# Patient Record
Sex: Male | Born: 1965 | Race: Black or African American | Hispanic: No | Marital: Married | Smoking: Current every day smoker
Health system: Southern US, Community
[De-identification: ages and names within clinical notes are randomized; demographics above are authoritative.]

## PROBLEM LIST (undated history)

## (undated) DIAGNOSIS — F419 Anxiety disorder, unspecified: Secondary | ICD-10-CM

## (undated) DIAGNOSIS — G459 Transient cerebral ischemic attack, unspecified: Secondary | ICD-10-CM

## (undated) DIAGNOSIS — E785 Hyperlipidemia, unspecified: Secondary | ICD-10-CM

## (undated) DIAGNOSIS — R519 Headache, unspecified: Secondary | ICD-10-CM

## (undated) DIAGNOSIS — M5030 Other cervical disc degeneration, unspecified cervical region: Secondary | ICD-10-CM

## (undated) DIAGNOSIS — I671 Cerebral aneurysm, nonruptured: Secondary | ICD-10-CM

## (undated) DIAGNOSIS — C801 Malignant (primary) neoplasm, unspecified: Secondary | ICD-10-CM

## (undated) DIAGNOSIS — K219 Gastro-esophageal reflux disease without esophagitis: Secondary | ICD-10-CM

## (undated) DIAGNOSIS — R51 Headache: Secondary | ICD-10-CM

## (undated) HISTORY — PX: OTHER SURGICAL HISTORY: SHX169

## (undated) HISTORY — PX: BIOPSY PHARYNX: SUR141

---

## 2005-07-23 ENCOUNTER — Encounter: Admission: RE | Admit: 2005-07-23 | Discharge: 2005-07-23 | Payer: Self-pay | Admitting: Neurosurgery

## 2013-06-22 ENCOUNTER — Encounter (HOSPITAL_BASED_OUTPATIENT_CLINIC_OR_DEPARTMENT_OTHER): Payer: Self-pay | Admitting: Emergency Medicine

## 2013-06-22 DIAGNOSIS — Z859 Personal history of malignant neoplasm, unspecified: Secondary | ICD-10-CM | POA: Insufficient documentation

## 2013-06-22 DIAGNOSIS — R071 Chest pain on breathing: Secondary | ICD-10-CM | POA: Insufficient documentation

## 2013-06-22 NOTE — ED Notes (Signed)
States had a disc compression his chiropracter popped in yesterday and today painful

## 2013-06-23 ENCOUNTER — Emergency Department (HOSPITAL_BASED_OUTPATIENT_CLINIC_OR_DEPARTMENT_OTHER)
Admission: EM | Admit: 2013-06-23 | Discharge: 2013-06-23 | Disposition: A | Discharge status: Home / Self Care | Payer: Medicaid Other | Attending: Emergency Medicine | Admitting: Emergency Medicine

## 2013-06-23 DIAGNOSIS — R0789 Other chest pain: Secondary | ICD-10-CM

## 2013-06-23 HISTORY — DX: Malignant (primary) neoplasm, unspecified: C80.1

## 2013-06-23 MED ORDER — OXYCODONE-ACETAMINOPHEN 5-325 MG PO TABS: 1.0000 | ORAL_TABLET | Freq: Four times a day (QID) | ORAL | Status: DC | PRN

## 2013-06-23 MED ORDER — HYDROMORPHONE HCL PF 1 MG/ML IJ SOLN
1.0000 mg | Freq: Once | INTRAMUSCULAR | Status: AC
  Administered 2013-06-23: 1 mg via INTRAMUSCULAR
  Filled 2013-06-23: qty 1

## 2013-06-23 NOTE — ED Provider Notes (Signed)
CSN: [id number]     Arrival date & time 06/22/13  2331 History   First MD Initiated Contact with Patient 06/23/13 0251     Chief Complaint  Patient presents with  . Rib Injury     (Consider location/radiation/quality/duration/timing/severity/associated sxs/prior Treatment) HPI This is a 48 year old male with a long-standing problem of degenerative disc disease in his neck and back. He states he has a rib in his left mid chest that frequently pops out of place. This rib has been causing him pain for the past 4 days. The pain is sharp, moderate to severe in intensity, and radiates to his left side and neck. The pain is somewhat relieved by resting his left hand on top of his head. He had the rib "pop back in place" by his chiropractor yesterday without adequate relief of the pain. He is requesting pain relief. Although he has a prescription for OxyContin at home he states he is unable to take this because it is too strong. He denies shortness of breath or neurologic deficits although the pain does radiate down his left upper extremity.  Past Medical History  Diagnosis Date  . Cancer    History reviewed. No pertinent past surgical history. No family history on file. History  Substance Use Topics  . Smoking status: Former Research scientist (life sciences)  . Smokeless tobacco: Former Systems developer    Quit date: 04/13/2013  . Alcohol Use: No    Review of Systems  All other systems reviewed and are negative.   Allergies  Review of patient's allergies indicates no known allergies.  Home Medications   Current Outpatient Rx  Name  Route  Sig  Dispense  Refill  . oxyCODONE (OXY IR/ROXICODONE) 5 MG immediate release tablet   Oral   Take 5 mg by mouth every 4 (four) hours as needed for severe pain.          BP 121/77  Pulse 70  Temp(Src) 97.9 F (36.6 C) (Oral)  Resp 16  Ht 5\' 9"  (1.753 m)  Wt 158 lb (71.668 kg)  BMI 23.32 kg/m2  SpO2 100%  Physical Exam General: Well-developed, well-nourished male in no  acute distress; appearance consistent with age of record HENT: normocephalic; atraumatic Eyes: pupils equal, round and reactive to light; extraocular muscles intact Neck: supple Heart: regular rate and rhythm Lungs: clear to auscultation bilaterally Chest: Left posterior chest wall point tenderness without deformity Abdomen: soft; nondistended Extremities: No deformity; full range of motion Neurologic: Awake, alert and oriented; motor function intact in all extremities and symmetric; no facial droop Skin: Warm and dry Psychiatric: Angry mood with congruent affect    ED Course  Procedures (including critical care time)    MDM      Wynetta Fines, MD 06/23/13 [phone]

## 2013-06-23 NOTE — ED Notes (Signed)
MD at bedside. 

## 2013-06-23 NOTE — ED Notes (Signed)
Rx x 1 given for percocet 

## 2013-06-23 NOTE — Discharge Instructions (Signed)

## 2013-12-29 ENCOUNTER — Emergency Department (HOSPITAL_BASED_OUTPATIENT_CLINIC_OR_DEPARTMENT_OTHER): Payer: Medicaid Other

## 2013-12-29 ENCOUNTER — Emergency Department (HOSPITAL_BASED_OUTPATIENT_CLINIC_OR_DEPARTMENT_OTHER)
Admission: EM | Admit: 2013-12-29 | Discharge: 2013-12-29 | Disposition: A | Discharge status: Home / Self Care | Payer: Medicaid Other | Attending: Emergency Medicine | Admitting: Emergency Medicine

## 2013-12-29 ENCOUNTER — Encounter (HOSPITAL_BASED_OUTPATIENT_CLINIC_OR_DEPARTMENT_OTHER): Payer: Self-pay | Admitting: Emergency Medicine

## 2013-12-29 DIAGNOSIS — K5900 Constipation, unspecified: Secondary | ICD-10-CM | POA: Diagnosis not present

## 2013-12-29 DIAGNOSIS — K219 Gastro-esophageal reflux disease without esophagitis: Secondary | ICD-10-CM | POA: Diagnosis not present

## 2013-12-29 DIAGNOSIS — Z859 Personal history of malignant neoplasm, unspecified: Secondary | ICD-10-CM | POA: Diagnosis not present

## 2013-12-29 DIAGNOSIS — R1084 Generalized abdominal pain: Secondary | ICD-10-CM | POA: Insufficient documentation

## 2013-12-29 DIAGNOSIS — Z87891 Personal history of nicotine dependence: Secondary | ICD-10-CM | POA: Diagnosis not present

## 2013-12-29 DIAGNOSIS — Z8739 Personal history of other diseases of the musculoskeletal system and connective tissue: Secondary | ICD-10-CM | POA: Diagnosis not present

## 2013-12-29 DIAGNOSIS — Z79899 Other long term (current) drug therapy: Secondary | ICD-10-CM | POA: Diagnosis not present

## 2013-12-29 DIAGNOSIS — T474X5A Adverse effect of other laxatives, initial encounter: Secondary | ICD-10-CM | POA: Diagnosis not present

## 2013-12-29 DIAGNOSIS — T472X5A Adverse effect of stimulant laxatives, initial encounter: Secondary | ICD-10-CM

## 2013-12-29 DIAGNOSIS — R109 Unspecified abdominal pain: Secondary | ICD-10-CM

## 2013-12-29 DIAGNOSIS — IMO0002 Reserved for concepts with insufficient information to code with codable children: Secondary | ICD-10-CM | POA: Diagnosis not present

## 2013-12-29 HISTORY — DX: Gastro-esophageal reflux disease without esophagitis: K21.9

## 2013-12-29 HISTORY — DX: Other cervical disc degeneration, unspecified cervical region: M50.30

## 2013-12-29 LAB — URINALYSIS, ROUTINE W REFLEX MICROSCOPIC
BILIRUBIN URINE: NEGATIVE
GLUCOSE, UA: NEGATIVE mg/dL
Hgb urine dipstick: NEGATIVE
Ketones, ur: 15 mg/dL — AB
Leukocytes, UA: NEGATIVE
NITRITE: NEGATIVE
PROTEIN: NEGATIVE mg/dL
Specific Gravity, Urine: 1.03 (ref 1.005–1.030)
UROBILINOGEN UA: 0.2 mg/dL (ref 0.0–1.0)
pH: 5.5 (ref 5.0–8.0)

## 2013-12-29 MED ORDER — DICYCLOMINE HCL 10 MG/ML IM SOLN
20.0000 mg | Freq: Once | INTRAMUSCULAR | Status: AC
  Administered 2013-12-29: 20 mg via INTRAMUSCULAR
  Filled 2013-12-29: qty 2

## 2013-12-29 NOTE — ED Notes (Signed)
Pt has ambulated to restroom x4 - reports improvement in pain.

## 2013-12-29 NOTE — ED Notes (Signed)
Pt reports acute onset of generalized abd pain that awoke pt from his sleep approx 0100 this a.m. - pt states that the first week of September he did not have a bowel movement, pt had x2 BMs Monday and Tuesday this week that were black in color - pt was seen by his pain management provider today and was advised to get OTC mag citrate and fleets enemas as he may be experiencing constipation d/t his rx of percocet. Pt took magnesium citrate approx 12:00 yesterday and the fleets enemas yesterday approx 2230 - pt states he has not had a bowel movement yet. C/o nausea - no vomiting.

## 2013-12-29 NOTE — ED Notes (Signed)
Pt ambulating independently w/ steady gait on d/c in no acute distress, A&Ox4.D/c instructions reviewed w/ pt and family - pt and family deny any further questions or concerns at present.  

## 2013-12-29 NOTE — ED Provider Notes (Signed)
CSN: [id number]     Arrival date & time 12/29/13  0138 History   First MD Initiated Contact with Patient 12/29/13 0242     Chief Complaint  Patient presents with  . Abdominal Pain     (Consider location/radiation/quality/duration/timing/severity/associated sxs/prior Treatment) Patient is a 48 y.o. male presenting with abdominal pain. The history is provided by the patient.  Abdominal Pain Pain location:  Generalized Pain quality: cramping   Pain radiates to:  Does not radiate Pain severity:  Moderate Onset quality:  Sudden Timing:  Constant Progression:  Resolved Chronicity:  New Context: laxative use   Context: not suspicious food intake   Relieved by:  Nothing Worsened by:  Nothing tried Ineffective treatments:  None tried Associated symptoms: constipation   Associated symptoms: no anorexia, no fever, no hematemesis and no vomiting   Risk factors: has not had multiple surgeries   No BM between 8/31 and 9/15 then had BM on Monday and Tuesday but still felt bloated and seen by pain management who thought his narcotics were to blame.  Talked to them on phone and they recommended mag citrate which he took on Thursday afternoon then no BM so he took an enema before bed and woke with cramping now > 5 liquid BMs and feels markedly improved  Past Medical History  Diagnosis Date  . Cancer   . DDD (degenerative disc disease), cervical   . Acid reflux    History reviewed. No pertinent past surgical history. History reviewed. No pertinent family history. History  Substance Use Topics  . Smoking status: Former Research scientist (life sciences)  . Smokeless tobacco: Former Systems developer    Quit date: 04/13/2013  . Alcohol Use: No    Review of Systems  Constitutional: Negative for fever.  Gastrointestinal: Positive for abdominal pain and constipation. Negative for vomiting, anorexia and hematemesis.  All other systems reviewed and are negative.     Allergies  Review of patient's allergies indicates no known  allergies.  Home Medications   Prior to Admission medications   Medication Sig Start Date End Date Taking? Authorizing Provider  esomeprazole (NEXIUM) 40 MG capsule Take 40 mg by mouth daily at 12 noon.   Yes Historical Provider, MD  fluticasone (FLONASE) 50 MCG/ACT nasal spray Place into both nostrils daily.   Yes Historical Provider, MD  sodium chloride (OCEAN) 0.65 % SOLN nasal spray Place 1 spray into both nostrils as needed for congestion.   Yes Historical Provider, MD  oxyCODONE (OXY IR/ROXICODONE) 5 MG immediate release tablet Take 5 mg by mouth every 4 (four) hours as needed for severe pain.    Historical Provider, MD  oxyCODONE-acetaminophen (PERCOCET/ROXICET) 5-325 MG per tablet Take 1-2 tablets by mouth every 6 (six) hours as needed (for pain). 06/23/13   John L Molpus, MD   BP 135/76  Pulse 94  Temp(Src) 98.4 F (36.9 C) (Oral)  Resp 20  SpO2 99% Physical Exam  Constitutional: He is oriented to person, place, and time. He appears well-developed and well-nourished. No distress.  HENT:  Head: Normocephalic and atraumatic.  Mouth/Throat: Oropharynx is clear and moist.  Eyes: Conjunctivae are normal. Pupils are equal, round, and reactive to light.  Neck: Normal range of motion. Neck supple.  Cardiovascular: Normal rate, regular rhythm and intact distal pulses.   Pulmonary/Chest: Effort normal and breath sounds normal. He has no wheezes. He has no rales.  Abdominal: Soft. He exhibits no distension and no mass. Bowel sounds are increased. There is no tenderness. There is no rigidity, no  rebound, no guarding, no tenderness at McBurney's point and negative Murphy's sign.  Musculoskeletal: Normal range of motion.  Neurological: He is alert and oriented to person, place, and time.  Skin: Skin is warm and dry.  Psychiatric: He has a normal mood and affect.    ED Course  Procedures (including critical care time) Labs Review Labs Reviewed  URINALYSIS, ROUTINE W REFLEX MICROSCOPIC  - Abnormal; Notable for the following:    Color, Urine AMBER (*)    Ketones, ur 15 (*)    All other components within normal limits    Imaging Review No results found.   EKG Interpretation None      MDM   Final diagnoses:  None   Exam and vitals benign and reassuring.  Patient feels back to normal.  No indication for advanced imaging at this time.    Suspect pain due to combination of mag citrate and enema.  Patient feels markedly improved.  Will refer back to GI for ongoing care.  Has endoscopy and colonoscopy scheduled.     Carlisle Beers, MD 12/29/13 [phone]

## 2013-12-30 ENCOUNTER — Encounter (HOSPITAL_BASED_OUTPATIENT_CLINIC_OR_DEPARTMENT_OTHER): Payer: Self-pay | Admitting: Emergency Medicine

## 2013-12-30 ENCOUNTER — Inpatient Hospital Stay (HOSPITAL_BASED_OUTPATIENT_CLINIC_OR_DEPARTMENT_OTHER)
Admission: EM | Admit: 2013-12-30 | Discharge: 2014-01-01 | DRG: 069 | Disposition: A | Discharge status: Home / Self Care | Payer: Medicaid Other | Attending: Family Medicine | Admitting: Family Medicine

## 2013-12-30 ENCOUNTER — Emergency Department (HOSPITAL_BASED_OUTPATIENT_CLINIC_OR_DEPARTMENT_OTHER): Payer: Medicaid Other

## 2013-12-30 DIAGNOSIS — Z923 Personal history of irradiation: Secondary | ICD-10-CM

## 2013-12-30 DIAGNOSIS — Z87891 Personal history of nicotine dependence: Secondary | ICD-10-CM

## 2013-12-30 DIAGNOSIS — G459 Transient cerebral ischemic attack, unspecified: Secondary | ICD-10-CM

## 2013-12-30 DIAGNOSIS — R531 Weakness: Secondary | ICD-10-CM

## 2013-12-30 DIAGNOSIS — K219 Gastro-esophageal reflux disease without esophagitis: Secondary | ICD-10-CM | POA: Diagnosis present

## 2013-12-30 DIAGNOSIS — I671 Cerebral aneurysm, nonruptured: Secondary | ICD-10-CM | POA: Diagnosis present

## 2013-12-30 DIAGNOSIS — F1210 Cannabis abuse, uncomplicated: Secondary | ICD-10-CM | POA: Diagnosis present

## 2013-12-30 DIAGNOSIS — E785 Hyperlipidemia, unspecified: Secondary | ICD-10-CM

## 2013-12-30 DIAGNOSIS — Z85819 Personal history of malignant neoplasm of unspecified site of lip, oral cavity, and pharynx: Secondary | ICD-10-CM

## 2013-12-30 DIAGNOSIS — Z79899 Other long term (current) drug therapy: Secondary | ICD-10-CM

## 2013-12-30 DIAGNOSIS — M5030 Other cervical disc degeneration, unspecified cervical region: Secondary | ICD-10-CM | POA: Diagnosis present

## 2013-12-30 DIAGNOSIS — R200 Anesthesia of skin: Secondary | ICD-10-CM

## 2013-12-30 HISTORY — DX: Transient cerebral ischemic attack, unspecified: G45.9

## 2013-12-30 LAB — BASIC METABOLIC PANEL
ANION GAP: 18 — AB (ref 5–15)
BUN: 14 mg/dL (ref 6–23)
CHLORIDE: 102 meq/L (ref 96–112)
CO2: 20 mEq/L (ref 19–32)
CREATININE: 1.2 mg/dL (ref 0.50–1.35)
Calcium: 9.7 mg/dL (ref 8.4–10.5)
GFR, EST AFRICAN AMERICAN: 80 mL/min — AB (ref >90)
GFR, EST NON AFRICAN AMERICAN: 69 mL/min — AB (ref >90)
GLUCOSE: 106 mg/dL — AB (ref 70–99)
Potassium: 4.1 mEq/L (ref 3.7–5.3)
Sodium: 140 mEq/L (ref 137–147)

## 2013-12-30 LAB — CBC WITH DIFFERENTIAL/PLATELET
BASOS PCT: 0 % (ref 0–1)
Basophils Absolute: 0 10*3/uL (ref 0.0–0.1)
EOS ABS: 0.1 10*3/uL (ref 0.0–0.7)
EOS PCT: 1 % (ref 0–5)
HEMATOCRIT: 41.8 % (ref 39.0–52.0)
HEMOGLOBIN: 14.4 g/dL (ref 13.0–17.0)
Lymphocytes Relative: 26 % (ref 12–46)
Lymphs Abs: 2.7 10*3/uL (ref 0.7–4.0)
MCH: 24 pg — ABNORMAL LOW (ref 26.0–34.0)
MCHC: 34.4 g/dL (ref 30.0–36.0)
MCV: 69.7 fL — ABNORMAL LOW (ref 78.0–100.0)
Monocytes Absolute: 0.8 10*3/uL (ref 0.1–1.0)
Monocytes Relative: 8 % (ref 3–12)
NEUTROS ABS: 6.8 10*3/uL (ref 1.7–7.7)
NEUTROS PCT: 65 % (ref 43–77)
PLATELETS: 269 10*3/uL (ref 150–400)
RBC: 6 MIL/uL — AB (ref 4.22–5.81)
RDW: 15.9 % — AB (ref 11.5–15.5)
WBC: 10.4 10*3/uL (ref 4.0–10.5)

## 2013-12-30 LAB — TROPONIN I

## 2013-12-30 NOTE — ED Provider Notes (Signed)
CSN: [id number]     Arrival date & time 12/30/13  2213 History  This chart was scribed for Julianne Rice, MD by Martinique Peace, ED Scribe. The patient was seen in [place]. The patient's care was started at 11:14 PM.      Chief Complaint  Patient presents with  . Extremity Weakness     HPI HPI Comments: Bradley Brock is a 48 y.o. male who presents to the Emergency Department complaining of numbness and weakness onset 9:45 PM tonight to left lower aspect of his face, his left arm and down his back into his left leg. He reports experiencing facial numbness that has partially resolved and facial droop that has completely resolved. Pt goes on to report that his left leg currently feels heavy. He notes much improvement in his symptoms and feeling better by his interpretation. He reports that he was very mad and anxious at onset of episode but denies any previous occurences regarding those emotions bringing on. Pt reports history of stroke 4 years ago where he had to be rushed to the hospital. He further states history of degenerative disease in his cervical spine and previous existing numbness in his left hand. He denies any changes in vision.    Past Medical History  Diagnosis Date  . Cancer   . DDD (degenerative disc disease), cervical   . Acid reflux    History reviewed. No pertinent past surgical history. History reviewed. No pertinent family history. History  Substance Use Topics  . Smoking status: Former Research scientist (life sciences)  . Smokeless tobacco: Former Systems developer    Quit date: 04/13/2013  . Alcohol Use: No    Review of Systems  Constitutional: Negative for fever and chills.  HENT:       Facial numbness and droopiness.   Eyes: Negative for visual disturbance.  Respiratory: Negative for cough and shortness of breath.   Cardiovascular: Negative for chest pain, palpitations and leg swelling.  Gastrointestinal: Negative for nausea, vomiting and abdominal pain.  Musculoskeletal: Negative for back  pain, neck pain and neck stiffness.  Skin: Negative for rash and wound.  Neurological: Positive for weakness and numbness. Negative for dizziness, speech difficulty and headaches.  Psychiatric/Behavioral: The patient is nervous/anxious.   All other systems reviewed and are negative.     Allergies  Review of patient's allergies indicates no known allergies.  Home Medications   Prior to Admission medications   Medication Sig Start Date End Date Taking? Authorizing Provider  esomeprazole (NEXIUM) 40 MG capsule Take 40 mg by mouth daily at 12 noon.    Historical Provider, MD  fluticasone (FLONASE) 50 MCG/ACT nasal spray Place into both nostrils daily.    Historical Provider, MD  oxyCODONE (OXY IR/ROXICODONE) 5 MG immediate release tablet Take 5 mg by mouth every 4 (four) hours as needed for severe pain.    Historical Provider, MD  oxyCODONE-acetaminophen (PERCOCET/ROXICET) 5-325 MG per tablet Take 1-2 tablets by mouth every 6 (six) hours as needed (for pain). 06/23/13   John L Molpus, MD  sodium chloride (OCEAN) 0.65 % SOLN nasal spray Place 1 spray into both nostrils as needed for congestion.    Historical Provider, MD   BP 110/79  Pulse 95  Temp(Src) 98.1 F (36.7 C) (Oral)  Resp 24  SpO2 100% Physical Exam  Nursing note and vitals reviewed. Constitutional: He is oriented to person, place, and time. He appears well-developed and well-nourished. No distress.  HENT:  Head: Normocephalic and atraumatic.  Mouth/Throat: Oropharynx is clear and moist.  Eyes: EOM are normal. Pupils are equal, round, and reactive to light.  Neck: Normal range of motion. Neck supple.  No meningismus. No severe cervical tenderness to palpation.  Cardiovascular: Normal rate and regular rhythm.   Pulmonary/Chest: Effort normal and breath sounds normal. No respiratory distress. He has no wheezes. He has no rales.  Abdominal: Soft. Bowel sounds are normal. He exhibits no distension and no mass. There is no  tenderness. There is no rebound.  Musculoskeletal: Normal range of motion. He exhibits no edema and no tenderness.  Neurological: He is alert and oriented to person, place, and time.  Patient is alert and oriented x3 with clear, goal oriented speech. Patient has 5/5 motor in bilateral upper and right lower extremities. Patient has 4/5 motor in left lower extremity. Decreased sensation to light touch and left lower face and left upper extremity. Bilateral finger-to-nose is normal with no signs of dysmetria. Visual fields intact   Skin: Skin is warm and dry. No rash noted. No erythema.  Psychiatric: He has a normal mood and affect. His behavior is normal.    ED Course  Procedures (including critical care time) Labs Review Labs Reviewed  CBC WITH DIFFERENTIAL - Abnormal; Notable for the following:    RBC 6.00 (*)    MCV 69.7 (*)    MCH 24.0 (*)    RDW 15.9 (*)    All other components within normal limits  BASIC METABOLIC PANEL  TROPONIN I    Imaging Review Dg Abd Acute W/chest  12/29/2013   CLINICAL DATA:  Constipation, abdominal pain and nausea. Abdominal bloating.  EXAM: ACUTE ABDOMEN SERIES (ABDOMEN 2 VIEW & CHEST 1 VIEW)  COMPARISON:  None.  FINDINGS: The lungs are well-aerated and clear. There is no evidence of focal opacification, pleural effusion or pneumothorax. The cardiomediastinal silhouette is within normal limits.  The visualized bowel gas pattern is unremarkable. Scattered fluid and air are seen within the colon; there is no evidence of small bowel dilatation to suggest obstruction. No free intra-abdominal air is identified on the provided upright view.  No acute osseous abnormalities are seen; the sacroiliac joints are unremarkable in appearance.  IMPRESSION: 1. Unremarkable bowel gas pattern; no free intra-abdominal air seen. Fluid seen partially filling the colon. No significant stool seen in the colon. 2. No acute cardiopulmonary process seen.   Electronically Signed   By:  Garald Balding M.D.   On: 12/29/2013 03:00     EKG Interpretation None     Medications - No data to display  11:19 PM- Treatment plan was discussed with patient who verbalizes understanding and agrees.   MDM   Final diagnoses:  None    I personally performed the services described in this documentation, which was scribed in my presence. The recorded information has been reviewed and is accurate. Code stroke initiated after exam. Symptoms appear to be resolving. Do not believe the patient is a candidate for TPA given low stroke scale score and improving symptoms.  Discussed with Dr. [name]. Recommends transfer to [facility] for [facility]. Dr. Tamera Punt will accept patient in transfer. Discussed with Dr. [name]. Recommends transfer to [facility] [place] for evaluation.   Julianne Rice, MD 12/31/13 [phone]

## 2013-12-30 NOTE — ED Notes (Signed)
Pt reports (L) facial, arm and leg weakness, heaviness, and numbness that was sudden onset at 945pm tonight.  Reports that he was very anxious at the time of onset.  Denies HA.  Reports that he has slight pain behind his (L) chest only when deep inspiration.  Denies change in vision.  Reports that he always has (L) arm numbness and weakness but that it is worse tonight.  While talking with patient, symptoms resolved.  Pt very tachypneic and diaphoretic on assessment.

## 2013-12-31 ENCOUNTER — Inpatient Hospital Stay (HOSPITAL_COMMUNITY): Payer: Medicaid Other

## 2013-12-31 ENCOUNTER — Encounter (HOSPITAL_COMMUNITY): Payer: Self-pay | Admitting: Internal Medicine

## 2013-12-31 DIAGNOSIS — K219 Gastro-esophageal reflux disease without esophagitis: Secondary | ICD-10-CM | POA: Diagnosis present

## 2013-12-31 DIAGNOSIS — Z85819 Personal history of malignant neoplasm of unspecified site of lip, oral cavity, and pharynx: Secondary | ICD-10-CM | POA: Diagnosis not present

## 2013-12-31 DIAGNOSIS — G459 Transient cerebral ischemic attack, unspecified: Secondary | ICD-10-CM | POA: Diagnosis not present

## 2013-12-31 DIAGNOSIS — F1210 Cannabis abuse, uncomplicated: Secondary | ICD-10-CM | POA: Diagnosis present

## 2013-12-31 DIAGNOSIS — Z79899 Other long term (current) drug therapy: Secondary | ICD-10-CM | POA: Diagnosis not present

## 2013-12-31 DIAGNOSIS — M6281 Muscle weakness (generalized): Secondary | ICD-10-CM

## 2013-12-31 DIAGNOSIS — R209 Unspecified disturbances of skin sensation: Secondary | ICD-10-CM

## 2013-12-31 DIAGNOSIS — Z87891 Personal history of nicotine dependence: Secondary | ICD-10-CM | POA: Diagnosis not present

## 2013-12-31 DIAGNOSIS — Z923 Personal history of irradiation: Secondary | ICD-10-CM | POA: Diagnosis not present

## 2013-12-31 DIAGNOSIS — M5030 Other cervical disc degeneration, unspecified cervical region: Secondary | ICD-10-CM | POA: Diagnosis present

## 2013-12-31 DIAGNOSIS — R531 Weakness: Secondary | ICD-10-CM | POA: Insufficient documentation

## 2013-12-31 LAB — TROPONIN I: Troponin I: <0.3 ng/mL (ref <0.30)

## 2013-12-31 LAB — TSH: TSH: 2.62 u[IU]/mL (ref 0.350–4.500)

## 2013-12-31 LAB — HEMOGLOBIN A1C
HEMOGLOBIN A1C: 6 % — AB (ref <5.7)
Mean Plasma Glucose: 126 mg/dL — ABNORMAL HIGH (ref <117)

## 2013-12-31 LAB — CBC WITH DIFFERENTIAL/PLATELET
Basophils Absolute: 0 10*3/uL (ref 0.0–0.1)
Basophils Relative: 0 % (ref 0–1)
Eosinophils Absolute: 0.2 10*3/uL (ref 0.0–0.7)
Eosinophils Relative: 2 % (ref 0–5)
HCT: 40.1 % (ref 39.0–52.0)
Hemoglobin: 13.5 g/dL (ref 13.0–17.0)
Lymphocytes Relative: 32 % (ref 12–46)
Lymphs Abs: 2.7 10*3/uL (ref 0.7–4.0)
MCH: 23.9 pg — ABNORMAL LOW (ref 26.0–34.0)
MCHC: 33.7 g/dL (ref 30.0–36.0)
MCV: 70.8 fL — AB (ref 78.0–100.0)
MONOS PCT: 8 % (ref 3–12)
Monocytes Absolute: 0.7 10*3/uL (ref 0.1–1.0)
NEUTROS ABS: 4.9 10*3/uL (ref 1.7–7.7)
Neutrophils Relative %: 58 % (ref 43–77)
Platelets: 250 10*3/uL (ref 150–400)
RBC: 5.66 MIL/uL (ref 4.22–5.81)
RDW: 14.1 % (ref 11.5–15.5)
WBC: 8.5 10*3/uL (ref 4.0–10.5)

## 2013-12-31 LAB — LIPID PANEL
Cholesterol: 165 mg/dL (ref 0–200)
HDL: 39 mg/dL — ABNORMAL LOW (ref >39)
LDL CALC: 113 mg/dL — AB (ref 0–99)
TRIGLYCERIDES: 65 mg/dL (ref <150)
Total CHOL/HDL Ratio: 4.2 RATIO
VLDL: 13 mg/dL (ref 0–40)

## 2013-12-31 LAB — RAPID URINE DRUG SCREEN, HOSP PERFORMED
Amphetamines: NOT DETECTED
BARBITURATES: NOT DETECTED
BENZODIAZEPINES: NOT DETECTED
Cocaine: NOT DETECTED
Opiates: NOT DETECTED
Tetrahydrocannabinol: POSITIVE — AB

## 2013-12-31 LAB — COMPREHENSIVE METABOLIC PANEL
ALBUMIN: 4.2 g/dL (ref 3.5–5.2)
ALT: 12 U/L (ref 0–53)
ANION GAP: 20 — AB (ref 5–15)
AST: 23 U/L (ref 0–37)
Alkaline Phosphatase: 60 U/L (ref 39–117)
BILIRUBIN TOTAL: 0.6 mg/dL (ref 0.3–1.2)
BUN: 15 mg/dL (ref 6–23)
CHLORIDE: 104 meq/L (ref 96–112)
CO2: 13 mEq/L — ABNORMAL LOW (ref 19–32)
CREATININE: 0.98 mg/dL (ref 0.50–1.35)
Calcium: 9.2 mg/dL (ref 8.4–10.5)
GFR calc Af Amer: >90 mL/min (ref >90)
GFR calc non Af Amer: >90 mL/min (ref >90)
Glucose, Bld: 78 mg/dL (ref 70–99)
Potassium: 4.3 mEq/L (ref 3.7–5.3)
Sodium: 137 mEq/L (ref 137–147)
Total Protein: 7.1 g/dL (ref 6.0–8.3)

## 2013-12-31 LAB — PROTIME-INR
INR: 1.03 (ref 0.00–1.49)
Prothrombin Time: 13.5 seconds (ref 11.6–15.2)

## 2013-12-31 LAB — APTT: aPTT: 32 seconds (ref 24–37)

## 2013-12-31 MED ORDER — ASPIRIN 300 MG RE SUPP: 300.0000 mg | Freq: Every day | RECTAL | Status: DC

## 2013-12-31 MED ORDER — STROKE: EARLY STAGES OF RECOVERY BOOK
Freq: Once | Status: AC
  Administered 2013-12-31: 03:00:00
  Filled 2013-12-31: qty 1

## 2013-12-31 MED ORDER — LORAZEPAM 2 MG/ML IJ SOLN
1.0000 mg | Freq: Once | INTRAMUSCULAR | Status: AC
  Administered 2013-12-31: 1 mg via INTRAVENOUS
  Filled 2013-12-31: qty 1

## 2013-12-31 MED ORDER — ASPIRIN 325 MG PO TABS
325.0000 mg | ORAL_TABLET | Freq: Every day | ORAL | Status: DC
  Administered 2013-12-31 – 2014-01-01 (×2): 325 mg via ORAL
  Filled 2013-12-31 (×2): qty 1

## 2013-12-31 MED ORDER — SIMVASTATIN 20 MG PO TABS
20.0000 mg | ORAL_TABLET | Freq: Every day | ORAL | Status: DC
  Administered 2013-12-31 – 2014-01-01 (×2): 20 mg via ORAL
  Filled 2013-12-31 (×2): qty 1

## 2013-12-31 MED ORDER — PANTOPRAZOLE SODIUM 40 MG PO TBEC
40.0000 mg | DELAYED_RELEASE_TABLET | Freq: Every day | ORAL | Status: DC
  Administered 2013-12-31 – 2014-01-01 (×2): 40 mg via ORAL
  Filled 2013-12-31 (×2): qty 1

## 2013-12-31 MED ORDER — OXYCODONE HCL 5 MG PO TABS: 5.0000 mg | ORAL_TABLET | ORAL | Status: DC | PRN

## 2013-12-31 MED ORDER — FLUTICASONE PROPIONATE 50 MCG/ACT NA SUSP
2.0000 | Freq: Every day | NASAL | Status: DC
  Filled 2013-12-31: qty 16

## 2013-12-31 MED ORDER — ENOXAPARIN SODIUM 40 MG/0.4ML ~~LOC~~ SOLN
40.0000 mg | Freq: Every day | SUBCUTANEOUS | Status: DC
  Administered 2013-12-31 – 2014-01-01 (×2): 40 mg via SUBCUTANEOUS
  Filled 2013-12-31 (×2): qty 0.4

## 2013-12-31 MED ORDER — SENNOSIDES-DOCUSATE SODIUM 8.6-50 MG PO TABS: 1.0000 | ORAL_TABLET | Freq: Every evening | ORAL | Status: DC | PRN

## 2013-12-31 NOTE — Progress Notes (Signed)
Pt trasnferred to unit from ED via EMS x 1. Pt alert and oriented upon arrival. No signs or symptoms of acute distress. No complaints of pain or discomfort. Pt connected to telemetry and central monitoring notified. Pt oriented to room and unit. Pt now resting in bed at lowest position with call light in reach. Will continue to monitor. Fortino Sic, RN, BSN 12/31/2013 2:35 AM

## 2013-12-31 NOTE — Consult Note (Signed)
Neurology Consultation Reason for Consult: Left-sided numbness Referring Physician: Stark Jock, D  CC: Left-sided numbness  History is obtained from: Patient  HPI: Bradley Brock is a 48 y.o. male with a history of esophageal cancer who presents with  left-sided numbness and weakness which is improving, though not completely gone. He states that around 9:45 PM, he noticed that his left face was numb. On arrival to [facility] [place], he was seen to have a left facial droop (per verbal report) that improved.   He continues to have mild left leg weakness and mild numbness, but these are improving   LKW: 9:45 PM tpa given?: no, mild symptoms, improving symptoms    ROS: A 14 point ROS was performed and is negative except as noted in the HPI.   Past Medical History  Diagnosis Date  . Cancer   . DDD (degenerative disc disease), cervical   . Acid reflux     Family History: No history of similar  Social History: Tob: Former smoker  Exam: Current vital signs: BP 123/82  Pulse 77  Temp(Src) 98.1 F (36.7 C) (Oral)  Resp 23  SpO2 100% Vital signs in last 24 hours: Temp:  [98.1 F (36.7 C)] 98.1 F (36.7 C) (09/19 2346) Pulse Rate:  [71-95] 77 (09/19 2330) Resp:  [20-24] 23 (09/19 2330) BP: (110-124)/(79-82) 123/82 mmHg (09/19 2330) SpO2:  [98 %-100 %] 100 % (09/19 2330)  General: In bed, NAD CV: Regular rate and rhythm Mental Status: Patient is awake, alert, oriented to person, place, month, year, and situation. Immediate and remote memory are intact. Patient is able to give a clear and coherent history. No signs of aphasia. He is able to correctly identify all aspects of the "cookie caper" picture. He has no visual neglect. He intermittently appears to extinguish to double simultaneous stimulation, but inconsistently. Cranial Nerves: II: Visual Fields are full. Pupils are equal, round, and reactive to light.  Discs are difficult to visualize. III,IV, VI: EOMI without  ptosis or diploplia.  V: Facial sensation is decreased on the left VII: Facial movement is symmetric.  VIII: hearing is intact to voice X: Uvula elevates symmetrically XI: Shoulder shrug is symmetric. XII: tongue is midline without atrophy or fasciculations.  Motor: Tone is normal. Bulk is normal. 5/5 strength was present in bilateral arms and right leg, 4+/5 strength in the left leg Sensory: Sensation is decreased in the left face, arm, leg. He does have improved sensation in his medial aspect of his left forearm compared to the rest of his arm. (Baseline) Deep Tendon Reflexes: 2+ and symmetric in the biceps and patellae.  Cerebellar: FNF intact bilaterally, his left heel-knee-shin is consistent with weakness. Gait: Able to ambulate independently but does favor his right leg   I have reviewed labs in epic and the results pertinent to this consultation are: BMP-unremarkable CBC-unremarkable  I have reviewed the images obtained: CT head-no acute findings  Impression: 48 year old male with left arm face and leg numbness and leg weakness most consistent with an acute ischemic stroke. The symptoms are very mild, and improving and therefore would not recommend IV TPA at this time. He will need be admitted for stroke workup.  Recommendations: 1. HgbA1c, fasting lipid panel 2. MRI, MRA  of the brain without contrast 3. Frequent neuro checks 4. Echocardiogram 5. Carotid dopplers 6. Prophylactic therapy-Antiplatelet med: Aspirin - dose 325mg  PO or 300mg  PR 7. Risk factor modification 8. Telemetry monitoring 9. PT consult, OT consult, Speech consult  Roland Rack, MD [facility] [phone]  If 7pm- 7am, please page neurology on call as listed in [place].

## 2013-12-31 NOTE — Progress Notes (Addendum)
STROKE TEAM PROGRESS NOTE   HISTORY Logen Heintzelman is a 48 y.o. male with a history of esophageal cancer who presents with left-sided numbness and weakness which is improving, though not completely gone. He states that around 9:45 PM, he noticed that his left face was numb. On arrival to [facility] [place], he was seen to have a left facial droop (per verbal report) that improved.   LKW: 9:45 PM  tpa given?: no, mild symptoms, improving symptoms  SUBJECTIVE (INTERVAL HISTORY) Patient is feeling better. Only his leg is still mildly numb.   OBJECTIVE Temp:  [98.1 F (36.7 C)-99 F (37.2 C)] 98.8 F (37.1 C) (09/20 0630) Pulse Rate:  [63-95] 63 (09/20 1704) Cardiac Rhythm:  [-]  Resp:  [14-24] 18 (09/20 1704) BP: (99-131)/(62-101) 102/62 mmHg (09/20 1704) SpO2:  [98 %-100 %] 100 % (09/20 1030) Weight:  [160 lb (72.576 kg)] 160 lb (72.576 kg) (09/20 0600)  No results found for this basename: GLUCAP,  in the last 168 hours  Recent Labs Lab 12/30/13 2227 12/31/13 0343  NA 140 137  K 4.1 4.3  CL 102 104  CO2 20 13*  GLUCOSE 106* 78  BUN 14 15  CREATININE 1.20 0.98  CALCIUM 9.7 9.2    Recent Labs Lab 12/31/13 0343  AST 23  ALT 12  ALKPHOS 60  BILITOT 0.6  PROT 7.1  ALBUMIN 4.2    Recent Labs Lab 12/30/13 2227 12/31/13 0343  WBC 10.4 8.5  NEUTROABS 6.8 4.9  HGB 14.4 13.5  HCT 41.8 40.1  MCV 69.7* 70.8*  PLT 269 250    Recent Labs Lab 12/30/13 2227 12/31/13 0343  TROPONINI <0.30 <0.30    Recent Labs  12/30/13 2227  LABPROT 13.5  INR 1.03    Recent Labs  12/29/13 0142  COLORURINE AMBER*  LABSPEC 1.030  PHURINE 5.5  GLUCOSEU NEGATIVE  HGBUR NEGATIVE  BILIRUBINUR NEGATIVE  KETONESUR 15*  PROTEINUR NEGATIVE  UROBILINOGEN 0.2  NITRITE NEGATIVE  LEUKOCYTESUR NEGATIVE       Component Value Date/Time   CHOL 165 12/31/2013 0343   TRIG 65 12/31/2013 0343   HDL 39* 12/31/2013 0343   CHOLHDL 4.2 12/31/2013 0343   VLDL 13 12/31/2013 0343   LDLCALC 113* 12/31/2013 0343   Lab Results  Component Value Date   HGBA1C 6.0* 12/31/2013      Component Value Date/Time   LABOPIA NONE DETECTED 12/31/2013 0327   COCAINSCRNUR NONE DETECTED 12/31/2013 0327   LABBENZ NONE DETECTED 12/31/2013 0327   AMPHETMU NONE DETECTED 12/31/2013 0327   THCU POSITIVE* 12/31/2013 0327   LABBARB NONE DETECTED 12/31/2013 0327    No results found for this basename: ETH,  in the last 168 hours  Ct Head Wo Contrast  12/31/2013     IMPRESSION: No acute intracranial process. If clinical concern for acute ischemia, MRI of the brain with diffusion-weighted sequences would be more sensitive.   Electronically Signed   By: Elon Alas   On: 12/31/2013 00:00   Mr Brain Wo Contrast  12/31/2013    IMPRESSION: No evidence of acute or subacute infarction.  Scattered punctate white matter foci in the frontal and parietal lobes, not likely of acute relevance.  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 Presumably, this is a fortuitous diagnosis. Follow-up is warranted.   Electronically Signed   By: Nelson Chimes M.D.   On: 12/31/2013 12:51   Dg Chest Port 1 View  12/31/2013   CLINICAL DATA:  Shortness  of breath.  EXAM: PORTABLE CHEST - 1 VIEW  COMPARISON:  Chest radiograph performed 12/29/2013  FINDINGS: The lungs are well-aerated and clear. There is no evidence of focal opacification, pleural effusion or pneumothorax.  The cardiomediastinal silhouette is within normal limits. No acute osseous abnormalities are seen.  IMPRESSION: No acute cardiopulmonary process seen.   Electronically Signed   By: Garald Balding M.D.   On: 12/31/2013 02:22   12/31/2013   CLINICAL DATA:  PHYSICAL EXAM  PHYSICAL EXAM Physical exam: Exam: Gen: NAD Eyes: anicteric sclerae, moist conjunctivae                    CV: RRR, no MRG Mental Status: Alert, following commands  Neuro: Detailed Neurologic Exam  Speech:    No aphasia, No dysarthria  Cranial Nerves:    The pupils are  equal, round, and reactive to light. Conjugate gaze.  Flattening of left nasolabial fold. VFF.   Motor Observation:    no involuntary movements noted.    Strength:    Left leg 2+/5 otherwise 5/5        ASSESSMENT/PLAN  Mr. Johntay Doolen is a 48 y.o. male with history of esophageal cancer presenting with left-sided numbness. He didnot receive IV t-PA. MRI shows no evidence of acute or subacute infarction.  Scattered punctate white matter foci in the frontal and parietal lobes, not likely of acute relevance.  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  Should follow up with neurosurgery given possible risk of rupture Patients should be instructed to avoid smoking, heavy alcohol consumption, stimulant medications, illicit drugs, and excessive straining and Valsalva maneuvers Importance of normotensive BP Unclear etiology of left-sided symptoms however (aneurysm is on the left) If this is a TIA(MRI negative), suggest NSY comment on anti-platelet use for stroke prevention HgbA1c = 6, at increased risk for DM. Needs education on preventing DM LDL 113 goal < 100   Georgia Dom, MD Stroke Neurology    To contact Stroke Continuity provider, please refer to [email or web address] After hours, contact General Neurology

## 2013-12-31 NOTE — Discharge Instructions (Signed)
Cerebral Aneurysm  An aneurysm is the bulging or ballooning out of part of the weakened wall of a vein or artery. An aneurysm in the vein or artery of the brain is called a brain aneurysm, or cerebral aneurysm.   Aneurysms are a risk to your health because they may leak or rupture. Once the aneurysm leaks or ruptures, bleeding occurs. If the bleeding occurs within the brain tissue, the condition is called an intracerebral hemorrhage. An intracerebral hemorrhage can result in a hemorrhagic stroke. If the bleeding occurs in the area between the brain and the thin tissues that cover the brain, the condition is called a subarachnoid hemorrhage. This increases the pressure on the brain and causes some areas of the brain to not get the necessary blood flow. The blood from the ruptured aneurysm collects and presses on the surrounding brain tissue. A subarachnoid hemorrhage can cause a stroke. A ruptured cerebral aneurysm is a medical emergency. This can cause permanent damage and loss of brain function.  CAUSES  A cerebral aneurysm is caused when a weakened part of the blood vessel expands. The blood vessel expands due to the constant pressure from the flow of blood through the weakened blood vessel. Usually the aneurysm expands slowly. As the weakened aneurysm expands, the walls of the aneurysm become weaker. Aneurysms may be associated with diseases that weaken and damage the walls of your blood vessels or blood vessels that develop abnormally. Some known causes for cerebral aneurysms are:  · Head trauma.  · Infection.  · Use of "recreational drugs" such as cocaine or amphetamines.  RISK FACTORS  People at risk for a cerebral aneurysm or hemorrhagic stroke usually have one or more risk factors, which include:  · Having high blood pressure (hypertension).  · Abusing alcohol.  · Having abnormal blood vessels present since birth.  · Having certain bleeding disorders, such as hemophilia, sickle cell disease, or liver  disease.  · Taking blood thinners (anticoagulants).  · Smoking.  SIGNS AND SYMPTOMS   The signs and symptoms of an unruptured cerebral aneurysm will partly depend on its size and rate of growth. A small, unchanging aneurysm generally does not produce symptoms. A larger aneurysm that is steadily growing can increase pressure on the brain or nerves. That increased pressure from the unruptured cerebral aneurysm can cause:  · A headache.  · Problems with your vision.  · Numbness or weakness in an arm or leg.  · Problems with memory.  · Problems speaking.  · Seizures.  If an aneurysm leaks or bursts, it can cause a stroke and be life-threatening. Symptoms may include:  · A sudden, severe headache with no known cause. The headache is often described as the worst headache ever experienced.  · Nausea or vomiting, especially when combined with other symptoms such as a headache.  · Sudden weakness or numbness of the face, arm, or leg, especially on one side of the body.  · Sudden trouble walking or difficulty moving arms or legs.  · Sudden confusion.  · Sudden personality changes.  · Trouble speaking (aphasia) or understanding.  · Difficulty swallowing.  · Sudden trouble seeing in one or both eyes.  · Double vision.  · Dizziness.  · Loss of balance or coordination.  · Intolerance to light.  · Stiff neck.  DIAGNOSIS   A CTA (computed tomographic angiography) may be performed to diagnose an aneurysm. A CTA uses dye and a CT scanner to take images of your blood vessels. An   MRA (magnetic resonance angiography) may be used to diagnose an aneurysm. An MRA is performed in an MRI machine. While in the MRI machine, images of your blood vessels are taken. A cerebral aneurysm may also be diagnosed with a cerebral angiogram. A cerebral angiogram requires a tube called a catheter to be inserted into a blood vessel and advanced to the blood vessels in your neck. Dye is then injected while X-ray images are taken to show the blood vessels in  your brain.  TREATMENT   Unruptured Aneurysms  Treatment is complex when an aneurysm is found and it is not causing problems. Treatment is very individualized, as each case is different. Many things must be considered, such as the size and exact location of your aneurysm, your age, your overall health, and your feelings and preferences. Small aneurysms in certain locations of the brain have a very low chance of bleeding or rupturing. These small aneurysms may not be treated. However, depending on the size and location of the aneurysm, treatments may be recommended and include:  · Coiling. During this procedure, a catheter is inserted and advanced through a blood vessel. Once the catheter reaches the aneurysm, tiny coils are used to block blood flow into the aneurysm.  · Surgical clipping. During surgery, a clip is placed at the base of the aneurysm. The clip prevents blood from continuing to enter the aneurysm.  Ruptured Aneurysms  Immediate emergency surgery may be needed to help prevent damage to the brain and to reduce the risk of rebleeding. Timing of treatment is an important factor in the prevention of complications. Successful early treatment of a ruptured aneurysm (within the first 3 days of a bleed) helps to prevent rebleeding and blood vessel spasm. In some cases, there may be a reason to treat later (10-14 days after a rupture). Many things are considered when making this decision, and each case is handled individually.  HOME CARE INSTRUCTIONS  · Take medicines only as instructed by your health care provider.  · Eat healthy foods. It is recommended that you eat 5 or more servings of fruits and vegetables each day. Foods may need to be a special consistency (soft or pureed), or small bites may need to be taken if you have had a ruptured aneurysm or stroke. Certain dietary changes may be advised to address high blood pressure, high cholesterol, diabetes, or obesity.  ¨ Food choices that are low in salt  (sodium), saturated fat, trans fat, and cholesterol are recommended to manage high blood pressure.  ¨ Food choices that are high in fiber and low in saturated fat, trans fat, and cholesterol are recommended to control cholesterol levels.  ¨ Controlling carbohydrate and sugar intake is recommended to manage diabetes.  ¨ Reducing calorie intake and making food choices that are low in sodium, saturated fat, trans fat, and cholesterol are recommended to manage obesity.  · Maintain a healthy weight.  · Stay physically active. It is recommended that you get at least 30 minutes of activity on most or all days.  · Do not smoke.  · Limit alcohol use. Moderate alcohol use is considered to be:  ¨ No more than 2 drinks each day for men.  ¨ No more than 1 drink each day for nonpregnant women.  · Stop drug abuse.  · A safe home environment is important to reduce the risk of falls. Your health care provider may arrange for specialists to evaluate your home. Having grab bars in the bedroom and   bathroom is often important. Your health care provider may arrange for special equipment to be used at home, such as raised toilets and a seat for the shower.  · Physical, occupational, and speech therapy. Ongoing therapy may be needed to maximize your recovery after a ruptured aneurysm or stroke. If you have been advised to use a walker or a cane, use it at all times. Be sure to keep your therapy appointments.  · Follow all instructions for follow-up with your health care provider. This is very important. This includes any referrals, physical therapy, rehabilitation, and laboratory tests. Proper follow-up may prevent an aneurysm rupture or a stroke.  SEEK IMMEDIATE MEDICAL CARE IF:  · You have a sudden, severe headache with no known cause.  · You have sudden nausea or vomiting with a severe headache.  · You have sudden weakness or numbness of the face, arm, or leg, especially on one side of the body.  · You have sudden trouble walking or  difficulty moving arms or legs.  · You have sudden confusion.  · You have trouble speaking or understanding.  · You have sudden trouble seeing in one or both eyes.  · You have a sudden loss of balance or coordination.  · You have a stiff neck.  · You have difficulty breathing.  · You have a partial or total loss of consciousness.  Any of these symptoms may represent a serious problem that is an emergency. Do not wait to see if the symptoms will go away. Get medical help at once. Call your local emergency services (911 in [place] Do not drive yourself to the hospital.  Document Released: 12/20/2001 Document Revised: 08/14/2013 Document Reviewed: 09/15/2012  Exit[employer]® Patient Information ©2015 [employer]. This information is not intended to replace advice given to you by your health care provider. Make sure you discuss any questions you have with your health care provider.

## 2013-12-31 NOTE — Progress Notes (Signed)
48 y.o male transferred from [facility] [place] originally called at 2325 this evening. Pt arrived to [facility] earlier complaining of left facial numbness and droop, left arm, left leg weakness and numbness beginning at 2145. Upon arrival to [facility] at 0035 pt symptoms have improved, yet his left leg weakness remains. NIHSS done yielding 3 for left leg weakness, sensory deficit on left leg and arm, and partial neglect on left side. Pt history include esophageal cancer, DDD, acid reflux. Pt is not currently on blood thinners. Last radiation treatment done in February, per pt his CA is in remission at this time. CT and labs completed at [facility], CT negative per report. Per Dr.Kirkpatrick pt symptoms are too good to treat and request close monitoring while inside the TPA window until 0215. [facility] to admit once pt out of window to [place].

## 2013-12-31 NOTE — H&P (Signed)
[facility] History and Physical  Bradley Brock IDP:[id number] DOB: 05-18-1965 DOA: 12/30/2013  Referring physician: ER physician. PCP: Pcp Not In System   Chief Complaint: Left-sided numbness.  HPI: Bradley Brock is a 48 y.o. male with history of throat cancer status post radiation presents to the ER because of left sided numbness. Patient states around 9:45 PM last night patient started developing sudden onset of left hand cramps followed by numbness which started involving the face and the left leg. Symptoms last at around 34-40 minutes. Patient came to the ER. On call neurologist Dr. Leonel Brock had seen the patient and patient had a CT head which did not show anything acute. Patient's symptoms have largely improved. Patient has been admitted for further management. Patient otherwise denies any weakness of the extremities or any visual symptoms headache difficulty swallowing or speaking.   Review of Systems: As presented in the history of presenting illness, rest negative.  Past Medical History  Diagnosis Date  . Cancer   . DDD (degenerative disc disease), cervical   . Acid reflux    Past Surgical History  Procedure Laterality Date  . No past surgeries     Social History:  reports that he has quit smoking. He quit smokeless tobacco use about 8 months ago. He reports that he uses illicit drugs (Marijuana). He reports that he does not drink alcohol. Where does patient live home. Can patient participate in ADLs? Yes.  No Known Allergies  Family History:  Family History  Problem Relation Age of Onset  . Cancer Mother   . Stomach cancer Brother   . Testicular cancer Brother       Prior to Admission medications   Medication Sig Start Date End Date Taking? Authorizing Provider  esomeprazole (NEXIUM) 40 MG capsule Take 40 mg by mouth daily at 12 noon.    Historical Provider, MD  fluticasone (FLONASE) 50 MCG/ACT nasal spray Place into both nostrils daily.    Historical  Provider, MD  oxyCODONE (OXY IR/ROXICODONE) 5 MG immediate release tablet Take 5 mg by mouth every 4 (four) hours as needed for severe pain.    Historical Provider, MD  oxyCODONE-acetaminophen (PERCOCET/ROXICET) 5-325 MG per tablet Take 1-2 tablets by mouth every 6 (six) hours as needed (for pain). 06/23/13   John L Molpus, MD  sodium chloride (OCEAN) 0.65 % SOLN nasal spray Place 1 spray into both nostrils as needed for congestion.    Historical Provider, MD    Physical Exam: Filed Vitals:   12/30/13 2346 12/31/13 0045 12/31/13 0100 12/31/13 0115  BP:  131/101 122/78 127/91  Pulse:  81 83 83  Temp: 98.1 F (36.7 C)     TempSrc:      Resp:  18 14 19   SpO2:  100% 99% 100%     General:  Well-developed and nourished.  Eyes: Anicteric no pallor.  ENT: No discharge from the ears or nose mouth.  Neck: No mass felt.  Cardiovascular: S1-S2 heard.  Respiratory: No rhonchi or crepitations.  Abdomen: Soft nontender bowel sounds present.  Skin: No rash.  Musculoskeletal: No edema.  Psychiatric: Appears normal.  Neurologic: Alert oriented to time place and person. Moves all extremities 5 x 5.  Labs on Admission:  Basic Metabolic Panel:  Recent Labs Lab 12/30/13 2227  NA 140  K 4.1  CL 102  CO2 20  GLUCOSE 106*  BUN 14  CREATININE 1.20  CALCIUM 9.7   Liver Function Tests: No results found for this basename: AST, ALT,  ALKPHOS, BILITOT, PROT, ALBUMIN,  in the last 168 hours No results found for this basename: LIPASE, AMYLASE,  in the last 168 hours No results found for this basename: AMMONIA,  in the last 168 hours CBC:  Recent Labs Lab 12/30/13 2227  WBC 10.4  NEUTROABS 6.8  HGB 14.4  HCT 41.8  MCV 69.7*  PLT 269   Cardiac Enzymes:  Recent Labs Lab 12/30/13 2227  TROPONINI <0.30    BNP (last 3 results) No results found for this basename: PROBNP,  in the last 8760 hours CBG: No results found for this basename: GLUCAP,  in the last 168  hours  Radiological Exams on Admission: Ct Head Wo Contrast  12/31/2013   CLINICAL DATA:  Acute onset LEFT facial, arm and leg weakness. Baseline of LEFT arm numbness and weakness, now worse.  EXAM: CT HEAD WITHOUT CONTRAST  TECHNIQUE: Contiguous axial images were obtained from the base of the skull through the vertex without intravenous contrast.  COMPARISON:  CT of the head February 13, 2012  FINDINGS: The ventricles and sulci are normal. No intraparenchymal hemorrhage, mass effect nor midline shift. No acute large vascular territory infarcts.  No abnormal extra-axial fluid collections. Basal cisterns are patent.  No skull fracture. The included ocular globes and orbital contents are non-suspicious. Bilateral maxillary mucosal retention cysts without paranasal sinus air-fluid levels. The mastoid air cells are well aerated.  IMPRESSION: No acute intracranial process. If clinical concern for acute ischemia, MRI of the brain with diffusion-weighted sequences would be more sensitive.   Electronically Signed   By: Bradley Brock   On: 12/31/2013 00:00   Dg Abd Acute W/chest  12/29/2013   CLINICAL DATA:  Constipation, abdominal pain and nausea. Abdominal bloating.  EXAM: ACUTE ABDOMEN SERIES (ABDOMEN 2 VIEW & CHEST 1 VIEW)  COMPARISON:  None.  FINDINGS: The lungs are well-aerated and clear. There is no evidence of focal opacification, pleural effusion or pneumothorax. The cardiomediastinal silhouette is within normal limits.  The visualized bowel gas pattern is unremarkable. Scattered fluid and air are seen within the colon; there is no evidence of small bowel dilatation to suggest obstruction. No free intra-abdominal air is identified on the provided upright view.  No acute osseous abnormalities are seen; the sacroiliac joints are unremarkable in appearance.  IMPRESSION: 1. Unremarkable bowel gas pattern; no free intra-abdominal air seen. Fluid seen partially filling the colon. No significant stool seen in the  colon. 2. No acute cardiopulmonary process seen.   Electronically Signed   By: Garald Balding M.D.   On: 12/29/2013 03:00    EKG: Independently reviewed. Normal sinus rhythm with sinus arrhythmia.  Assessment/Plan Principal Problem:   Left sided numbness Active Problems:   History of throat cancer   1. Left-sided numbness - I have discussed with Dr. Leonel Brock on call neurologist. Dr. Leonel Brock feels that patient may have a stroke at this time and patient has been placed on neurochecks and swallow evaluation. Check MRI/MRA brain 2-D echo carotid Doppler. Aspirin. 2. History of throat cancer status post radiation in remission. 3. History of tobacco abuse  - now quit more than 9 months.   Chest x-ray pending.    Code Status: Full code.   Family Communication: None.   Disposition Plan: Admit to inpatient.    Eldonna Neuenfeldt N. [facility] Pager [phone].  If 7PM-7AM, please contact night-coverage [email or web address] Password [email or web address] 12/31/2013, 1:48 AM

## 2013-12-31 NOTE — ED Provider Notes (Signed)
[facility] sent [facility] as a code stroke. Patient started at approximately 945 this evening with left facial numbness and left arm and left leg weakness. He presented to [facility] with these complaints and underwent laboratory studies and head CT which were unremarkable. Code stroke was called the patient was transferred here for acute neurologic evaluation. While in route his symptoms have nearly completely resolved.  Patient was evaluated by Dr. [name] from neurology who is recommending admission to medicine. I've spoken with Dr. Hal Hope who agrees to admit the patient.  Veryl Speak, MD 12/31/13 [phone]

## 2013-12-31 NOTE — Progress Notes (Signed)
Pt seen and evaluated earlier this am by my associate. Will plan on reassessing next am.  Velvet Bathe

## 2013-12-31 NOTE — ED Notes (Addendum)
Patient arrived from [facility] by [facility]  Dr [name] at the bedside

## 2014-01-01 DIAGNOSIS — I671 Cerebral aneurysm, nonruptured: Secondary | ICD-10-CM | POA: Diagnosis present

## 2014-01-01 DIAGNOSIS — G459 Transient cerebral ischemic attack, unspecified: Secondary | ICD-10-CM

## 2014-01-01 MED ORDER — ASPIRIN 325 MG PO TABS: 325.0000 mg | ORAL_TABLET | Freq: Every day | ORAL | Status: AC

## 2014-01-01 MED ORDER — SIMVASTATIN 20 MG PO TABS: 20.0000 mg | ORAL_TABLET | Freq: Every day | ORAL | Status: DC

## 2014-01-01 NOTE — Evaluation (Signed)
Physical Therapy Evaluation Patient Details Name: Zaquan Duffner MRN: [id number] DOB: [birth year] Today's Date: 01/01/2014   History of Present Illness   Bradley Brock is a 48 y.o. male with history of throat cancer status post radiation presents to the ER because of left sided numbness. Patient states around 9:45 PM last night patient started developing sudden onset of left hand cramps followed by numbness which started involving the face and the left leg. Symptoms last at around 34-40 minutes. Patient came to the ER. On call neurologist Dr. [name] had seen the patient and patient had a CT head which did not show anything acute. Patient's symptoms have largely improved, but still remains in the L leg. Patient has been admitted for further management  Clinical Impression  Pt admitted with/for s/s of stroke with residual signs in L leg and hand. MRI negative for acute event.  Pt currently limited functionally due to the problems listed below.  (see problems list.)  Pt will benefit from PT to maximize function and safety to be able to get home safely with available assist of family.     Follow Up Recommendations Outpatient PT    Equipment Recommendations  Other (comment) (TBA)    Recommendations for Other Services       Precautions / Restrictions Precautions Precautions: Fall Restrictions Weight Bearing Restrictions: No      Mobility  Bed Mobility Overal bed mobility: Modified Independent                Transfers Overall transfer level: Needs assistance   Transfers: Sit to/from Stand Sit to Stand: Supervision         General transfer comment: cues for hand placement and general safety  Ambulation/Gait Ambulation/Gait assistance: Supervision Ambulation Distance (Feet): 130 Feet Assistive device: Rolling walker (2 wheeled) Gait Pattern/deviations: Step-through pattern;Decreased step length - right;Decreased stance time - left;Decreased stride length (mild ataxia and  stiff legged gait with poor heel/toe gait)     General Gait Details: tends not to put his foot down due to the "pins and needles"  Stairs            Wheelchair Mobility    Modified Rankin (Stroke Patients Only)       Balance Overall balance assessment: Needs assistance Sitting-balance support: No upper extremity supported Sitting balance-Leahy Scale: Good     Standing balance support: No upper extremity supported Standing balance-Leahy Scale: Fair Standing balance comment: uses RW to keep weight off of foot.                             Pertinent Vitals/Pain Pain Assessment: Faces Faces Pain Scale: Hurts even more Pain Location: l leg Pain Descriptors / Indicators: Pins and needles Pain Intervention(s): Limited activity within patient's tolerance    Home Living Family/patient expects to be discharged to:: Private residence Living Arrangements: Spouse/significant other Available Help at Discharge: Family;Other (Comment) (wife works days and pt is alone) Type of Home: House Home Access: Stairs to enter Entrance Stairs-Rails: Psychiatric nurse of Steps: [place]: One level [place]: Environmental consultant - 2 wheels      Prior Function Level of Independence: Independent               Hand Dominance        Extremity/Trunk Assessment   Upper Extremity Assessment: LUE deficits/detail       LUE Deficits / Details: weak grip and arms; grip 3-, otherwise grossly >3+  Lower Extremity Assessment: LLE deficits/detail   LLE Deficits / Details: grossly >3+ and uncoordinated with parasthesias     Communication   Communication: No difficulties  Cognition Arousal/Alertness: Awake/alert Behavior During Therapy: WFL for tasks assessed/performed Overall Cognitive Status: Within Functional Limits for tasks assessed                      General Comments      Exercises        Assessment/Plan    PT Assessment Patient  needs continued PT services  PT Diagnosis Abnormality of gait;Acute pain   PT Problem List Decreased strength;Decreased activity tolerance;Decreased mobility;Pain;Decreased knowledge of use of DME  PT Treatment Interventions DME instruction;Gait training;Therapeutic activities;Patient/family education;Functional mobility training;Stair training   PT Goals (Current goals can be found in the Care Plan section) Acute Rehab PT Goals Patient Stated Goal: get rid of these pins and needles PT Goal Formulation: With patient Time For Goal Achievement: 01/08/14 Potential to Achieve Goals: Good    Frequency Min 3X/week   Barriers to discharge        Co-evaluation               End of Session   Activity Tolerance: Patient tolerated treatment well Patient left: in chair;with call bell/phone within reach Nurse Communication: Mobility status         Time: 2248-2500 PT Time Calculation (min): 25 min   Charges:   PT Evaluation $Initial PT Evaluation Tier I: 1 Procedure PT Treatments $Gait Training: 8-22 mins   PT G Codes:          Phil Michels, Tessie Fass 01/01/2014, 11:14 AM 01/01/2014  Donnella Sham, PT [phone]  (pager)

## 2014-01-01 NOTE — Progress Notes (Signed)
Patient d/c this evening. Will follow up with Outpatient PT. All questions answered and prescription given.

## 2014-01-01 NOTE — Evaluation (Addendum)
Occupational Therapy Evaluation Patient Details Name: Bradley Brock MRN: [id number] DOB: 02-May-1965 Today's Date: 01/01/2014    History of Present Illness  Bradley Brock is a 48 y.o. male with history of throat cancer status post radiation presents to the ER because of left sided numbness. Patient states around 9:45 PM last night patient started developing sudden onset of left hand cramps followed by numbness which started involving the face and the left leg. Symptoms last at around 34-40 minutes. Patient came to the ER. On call neurologist Dr. Leonel Brock had seen the patient and patient had a CT head which did not show anything acute. Patient's symptoms have largely improved, but still remains in the L leg. Patient has been admitted for further management   Clinical Impression   Pt admitted with above. Education provided to pt/spouse. No further OT needs.    Follow Up Recommendations  No OT follow up;Supervision - Intermittent    Equipment Recommendations  3 in 1 bedside comode    Recommendations for Other Services       Precautions / Restrictions Precautions Precautions: Fall Restrictions Weight Bearing Restrictions: No      Mobility Bed Mobility Overal bed mobility: Modified Independent                Transfers Overall transfer level: Needs assistance Equipment used: Rolling walker (2 wheeled) Transfers: Sit to/from Stand Sit to Stand: Supervision         General transfer comment: cues for hand placement    Balance                                            ADL Overall ADL's : Needs assistance/impaired                 Upper Body Dressing : Sitting;Supervision/safety   Lower Body Dressing: Set up;Sit to/from stand;Supervision/safety   Toilet Transfer: Supervision/safety;Ambulation;RW (bed)       Tub/ Shower Transfer: Min guard;Ambulation;Rolling walker   Functional mobility during ADLs: Supervision/safety;Rolling walker  (cues to look up and for gait technique) General ADL Comments: Educated on safety tips for home (use of bag on walker, safe shoewear, sitting for LB ADLs, rugs). Recommended spouse be with him for tub transfer. Educated on options for shower chair and discussed 3 in 1. Educated on BE FAST stroke education and recommended to avoid eating canned foods due to increased sodium. Discussed avoiding sharp/dangerous/hot surfaces due to decreased sensation in LUE.     Vision  Pt wears glasses all the time: Reports no change from baseline    Visual fields: No apparent deficits   Tracking/Visual Pursuits: Able to track stimulus in all quads without difficulty             Perception     Praxis      Pertinent Vitals/Pain Pain Assessment: No/denies pain     Hand Dominance     Extremity/Trunk Assessment Upper Extremity Assessment Upper Extremity Assessment: LUE deficits/detail LUE Deficits / Details: grossly 4/5 (residual) LUE Sensation: decreased light touch (residual)   Lower Extremity Assessment Lower Extremity Assessment: Defer to PT evaluation       Communication Communication Communication: No difficulties   Cognition Arousal/Alertness: Awake/alert Behavior During Therapy: WFL for tasks assessed/performed Overall Cognitive Status: Within Functional Limits for tasks assessed  General Comments       Exercises       Shoulder Instructions      Home Living Family/patient expects to be discharged to:: Private residence Living Arrangements: Spouse/significant other Available Help at Discharge: Family (wife works days) Type of Home: House Home Access: Stairs to enter Technical brewer of Steps: 9 Entrance Stairs-Rails: [place]: One level     Bathroom Shower/Tub: Teacher, early years/pre: Bradley Brock: Environmental consultant - 2 wheels          Prior Functioning/Environment Level of Independence:  Independent             OT Diagnosis:     OT Problem List:     OT Treatment/Interventions:      OT Goals(Current goals can be found in the care plan section) Acute Rehab OT Goals Patient Stated Goal: go home  OT Frequency:     Barriers to D/C:            Co-evaluation              End of Session Equipment Utilized During Treatment: Gait belt;Rolling walker  Activity Tolerance: Patient tolerated treatment well Patient left: in bed;with call bell/phone within reach;with family/visitor present   Time: 1421-1447 OT Time Calculation (min): 26 min Charges:  OT General Charges $OT Visit: 1 Procedure OT Evaluation $Initial OT Evaluation Tier I: 1 Procedure OT Treatments $Self Care/Home Management : 8-22 mins G-CodesBenito Brock OTR/L [phone] 01/01/2014, 4:26 PM

## 2014-01-01 NOTE — Progress Notes (Signed)
STROKE TEAM PROGRESS NOTE   HISTORY Bradley Brock is a 48 y.o. male with a history of esophageal cancer who presents with left-sided numbness and weakness which is improving, though not completely gone. He states that around 9:45 PM on  12/30/2013, he noticed that his left face was numb. On arrival to [facility] [place] at 2314, he was seen to have a left facial droop (per verbal report) that improved. tpa given?: no, mild symptoms, improving symptoms   SUBJECTIVE (INTERVAL HISTORY) Wife at bedside. Patient up in the chair. No new symptoms.   OBJECTIVE Temp:  [98.2 F (36.8 C)-98.5 F (36.9 C)] 98.2 F (36.8 C) (09/21 1101) Pulse Rate:  [63-85] 77 (09/21 1101) Cardiac Rhythm:  [-] Normal sinus rhythm (09/20 2100) Resp:  [16-18] 16 (09/21 1101) BP: (102-120)/(62-74) 120/69 mmHg (09/21 1101) SpO2:  [96 %-100 %] 100 % (09/21 1101)   Recent Labs Lab 12/30/13 2227 12/31/13 0343  NA 140 137  K 4.1 4.3  CL 102 104  CO2 20 13*  GLUCOSE 106* 78  BUN 14 15  CREATININE 1.20 0.98  CALCIUM 9.7 9.2    Recent Labs Lab 12/31/13 0343  AST 23  ALT 12  ALKPHOS 60  BILITOT 0.6  PROT 7.1  ALBUMIN 4.2    Recent Labs Lab 12/30/13 2227 12/31/13 0343  WBC 10.4 8.5  NEUTROABS 6.8 4.9  HGB 14.4 13.5  HCT 41.8 40.1  MCV 69.7* 70.8*  PLT 269 250    Recent Labs Lab 12/30/13 2227 12/31/13 0343  TROPONINI <0.30 <0.30    Recent Labs  12/30/13 2227  LABPROT 13.5  INR 1.03   No results found for this basename: COLORURINE, APPERANCEUR, LABSPEC, PHURINE, GLUCOSEU, HGBUR, BILIRUBINUR, KETONESUR, PROTEINUR, UROBILINOGEN, NITRITE, LEUKOCYTESUR,  in the last 72 hours     Component Value Date/Time   CHOL 165 12/31/2013 0343   TRIG 65 12/31/2013 0343   HDL 39* 12/31/2013 0343   CHOLHDL 4.2 12/31/2013 0343   VLDL 13 12/31/2013 0343   LDLCALC 113* 12/31/2013 0343   Lab Results  Component Value Date   HGBA1C 6.0* 12/31/2013      Component Value Date/Time   LABOPIA NONE DETECTED  12/31/2013 0327   COCAINSCRNUR NONE DETECTED 12/31/2013 0327   LABBENZ NONE DETECTED 12/31/2013 0327   AMPHETMU NONE DETECTED 12/31/2013 0327   THCU POSITIVE* 12/31/2013 0327   LABBARB NONE DETECTED 12/31/2013 0327    No results found for this basename: ETH,  in the last 168 hours  Ct Head Wo Contrast 12/31/2013   No acute intracranial process. If clinical concern for acute ischemia, MRI of the brain with diffusion-weighted sequences would be more sensitive.   Electronically Signed   By: Elon Alas   On: 12/31/2013 00:00   Mr Brain Wo Contrast 12/31/2013   No evidence of acute or subacute infarction.  Scattered punctate white matter foci in the frontal and parietal lobes, not likely of acute relevance.  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 Presumably, this is a fortuitous diagnosis. Follow-up is warranted.     Dg Chest Port 1 View 12/31/2013    No acute cardiopulmonary process seen.      PHYSICAL EXAM Exam: Gen: NAD Eyes: anicteric sclerae, moist conjunctivae                    CV: RRR, no MRG Mental Status: Alert, following commands Neuro: Detailed Neurologic Exam Speech:    No aphasia, No dysarthria Cranial Nerves:  The pupils are equal, round, and reactive to light. Conjugate gaze.  Flattening of left nasolabial fold. VFF.  Motor Observation:    no involuntary movements noted. Strength:    Left leg 2+/5 otherwise 5/5      ASSESSMENT/PLAN Mr. Bradley Brock is a 48 y.o. male with history of esophageal cancer presenting with left-sided numbness. He didnot receive IV t-PA. MRI shows no evidence of acute or subacute infarction, Dx: R brain TIA.  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   Placed on aspirin this admission due to TIA.  Follow up with Dr. Estanislado Pandy, neurovinterventenional radiologist as an OP. I have called Dr. Estanislado Pandy to arrange and added to followup on AVS.  Patient instructed to avoid smoking, heavy alcohol  consumption, stimulant medications, illicit drugs, and excessive straining and Valsalva maneuvers, importance of normotensive BP  Family hx aneurysm in his mother  HgbA1c = 6, at increased risk for DM. Education on preventing DM  LDL 113, recommend goal of < 100, placed on low dose statin  Hx esophageal cancer, treated with radiation, in remission  Ok for discharge from stroke standpoint. Stroke followup w/ Dr. Erlinda Hong in 2 months  Burnetta Sabin, MSN, RN, ANVP-BC, ANP-BC, GNP-BC [facility] Pager: [phone] 01/01/2014 1:22 PM   I have personally examined this patient, reviewed notes, independently viewed imaging studies, participated in medical decision making and plan of care. I have made any additions or clarifications directly to the above note. Agree with note above.   Antony Contras, MD Medical Director [facility] Pager: [phone] 01/01/2014 7:33 PM  To contact Stroke Continuity provider, please refer to [email or web address] After hours, contact General Neurology

## 2014-01-01 NOTE — Progress Notes (Signed)
VASCULAR LAB PRELIMINARY  PRELIMINARY  PRELIMINARY  PRELIMINARY  Carotid Dopplers completed.    Preliminary report:  1-39% ICA stenosis.  Vertebral artery flow is antegrade.   Nathaniel Yaden, RVT 01/01/2014, 11:33 AM

## 2014-01-01 NOTE — Discharge Summary (Signed)
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 - Patient to followup with neurologist Dr. Erlinda Hong in 2 months  Discharge Diagnoses:  Please see list below   Discharge Condition: Stable  Diet recommendation: heart healthy  Filed Weights   12/31/13 0600  Weight: 72.576 kg (160 lb)    History of present illness:  From original history of present illness 48 y.o. male with history of throat cancer status post radiation presents to the ER because of left sided numbness. 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 Discussed with neurosurgeon who was okay with initiating aspirin and recommended patient following up with neurosurgery as outpatient. - Patient to followup with neurologist in 2 months - Patient started on aspirin and low-dose statin - Recommendations were made to avoid smoking, heavy alcohol consumption, stimulant medications, illicit drugs, and excessive straining and Valsalva maneuvers   Procedures: Ct Head Wo Contrast  12/31/2013 No acute intracranial process. If clinical concern for acute ischemia, MRI of the brain with diffusion-weighted sequences would be more sensitive. Electronically Signed By: Elon Alas On: 12/31/2013 00:00   Mr Brain Wo Contrast  12/31/2013 No evidence of acute or subacute infarction. Scattered punctate white matter foci in the frontal and parietal lobes,  not likely of acute relevance. 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 Presumably, this is a fortuitous diagnosis. Follow-up is warranted.   Echocardiogram: EF of 55-60 percent no wall motion abnormality  Carotid dopplers: negative for stricture  Consultations:  Neurology  Discharge Exam: Filed Vitals:   01/01/14 1401  BP: 108/62  Pulse: 87  Temp: 98.7 F (37.1 C)  Resp: 16    General: Pt in nad, alert and awake Cardiovascular: rrr, no mrg Respiratory: cta bl, no wheezes  Discharge Instructions You were cared for by a hospitalist during your hospital stay. If you have any questions about your discharge medications or the care you received while you were in the hospital after you are discharged, you can call the unit and asked to speak with the hospitalist on call if the hospitalist that took care of you is not available. Once you are discharged, your primary care physician will handle any further medical issues. Please note that NO REFILLS for any discharge medications will be authorized once you are discharged, as it is imperative that you return to your primary care physician (or establish a relationship with a primary care physician if you do not have one) for your aftercare needs so that they can reassess your need for medications and monitor your lab values.  Discharge Instructions   Call MD for:  extreme fatigue    Complete by:  As directed      Call MD for:  temperature >100.4    Complete by:  As directed      Diet - low sodium heart healthy    Complete by:  As directed      Increase activity slowly  Complete by:  As directed           Current Discharge Medication List    START taking these medications   Details  aspirin 325 MG tablet Take 1 tablet (325 mg total) by mouth daily. Qty: 30 tablet, Refills: 0    simvastatin (ZOCOR) 20 MG tablet Take 1 tablet (20 mg total) by mouth daily at 6 PM. Qty: 30 tablet, Refills: 0   Associated Diagnoses:  Hyperlipidemia      CONTINUE these medications which have NOT CHANGED   Details  esomeprazole (NEXIUM) 40 MG capsule Take 40 mg by mouth daily at 12 noon.    fluticasone (FLONASE) 50 MCG/ACT nasal spray Place into both nostrils daily.    oxyCODONE-acetaminophen (PERCOCET) 10-325 MG per tablet Take 1 tablet by mouth every 6 (six) hours as needed for pain.    sodium chloride (OCEAN) 0.65 % SOLN nasal spray Place 1 spray into both nostrils as needed for congestion.      STOP taking these medications     oxyCODONE (OXY IR/ROXICODONE) 5 MG immediate release tablet        No Known Allergies Follow-up Information   Schedule an appointment as soon as possible for a visit with DEVESHWAR,SANJEEV K, MD. (to follow up aneurysm. anticipate he will call you. you may call Anderson Malta at [phone] to schedule an appointment with Dr. Estanislado Pandy)    Specialty:  Interventional Radiology   Contact information:   [address], [address] [place] [address] [phone]       Follow up with Xu,Jindong, MD. Schedule an appointment as soon as possible for a visit in 2 months. (Stroke [facility], Dr. Erlinda Hong is a stroke MD and Dr. Clydene Fake partner)    Specialty:  Neurology   Contact information:   [address] [place] [address] [phone]        The results of significant diagnostics from this hospitalization (including imaging, microbiology, ancillary and laboratory) are listed below for reference.    Significant Diagnostic Studies: Ct Head Wo Contrast  12/31/2013   CLINICAL DATA:  Acute onset LEFT facial, arm and leg weakness. Baseline of LEFT arm numbness and weakness, now worse.  EXAM: CT HEAD WITHOUT CONTRAST  TECHNIQUE: Contiguous axial images were obtained from the base of the skull through the vertex without intravenous contrast.  COMPARISON:  CT of the head February 13, 2012  FINDINGS: The ventricles and sulci are normal. No intraparenchymal hemorrhage, mass effect nor midline  shift. No acute large vascular territory infarcts.  No abnormal extra-axial fluid collections. Basal cisterns are patent.  No skull fracture. The included ocular globes and orbital contents are non-suspicious. Bilateral maxillary mucosal retention cysts without paranasal sinus air-fluid levels. The mastoid air cells are well aerated.  IMPRESSION: No acute intracranial process. If clinical concern for acute ischemia, MRI of the brain with diffusion-weighted sequences would be more sensitive.   Electronically Signed   By: Elon Alas   On: 12/31/2013 00:00   Mr Brain Wo Contrast  12/31/2013   CLINICAL DATA:  Left-sided numbness.  EXAM: MRI HEAD WITHOUT CONTRAST  MRA HEAD WITHOUT CONTRAST  TECHNIQUE: Multiplanar, multiecho pulse sequences of the brain and surrounding structures were obtained without intravenous contrast. Angiographic images of the head were obtained using MRA technique without contrast.  COMPARISON:  Head CT 12/30/2013  FINDINGS: MRI HEAD FINDINGS  Diffusion imaging does not show any acute or subacute infarction. The brainstem and cerebellum are normal. No abnormality of the  thalami or basal ganglia. There are scattered punctate foci of T2 and FLAIR signal within the frontal and parietal white matter. This could represent an early manifestation of small vessel disease. The differential diagnosis includes migraine related foci and evidence of old closed head injury. No cortical or large vessel territory abnormality. No mass lesion, hemorrhage, hydrocephalus or extra-axial collection. No pituitary mass. No inflammatory sinus disease. Maxillary sinus retention cysts are incidentally noted.  MRA HEAD FINDINGS  Both internal carotid arteries are widely patent into the brain. No siphon stenosis. There is an aneurysm at the left MCA bifurcation measuring approximately 4 mm in diameter. This may be bilobed.  Both vertebral arteries are widely patent to the basilar. No basilar stenosis. Posterior  circulation branch vessels appear normal.  IMPRESSION: No evidence of acute or subacute infarction.  Scattered punctate white matter foci in the frontal and parietal lobes, not likely of acute relevance.  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 Presumably, this is a fortuitous diagnosis. Follow-up is warranted.   Electronically Signed   By: Nelson Chimes M.D.   On: 12/31/2013 12:51   Dg Chest Port 1 View  12/31/2013   CLINICAL DATA:  Shortness of breath.  EXAM: PORTABLE CHEST - 1 VIEW  COMPARISON:  Chest radiograph performed 12/29/2013  FINDINGS: The lungs are well-aerated and clear. There is no evidence of focal opacification, pleural effusion or pneumothorax.  The cardiomediastinal silhouette is within normal limits. No acute osseous abnormalities are seen.  IMPRESSION: No acute cardiopulmonary process seen.   Electronically Signed   By: Garald Balding M.D.   On: 12/31/2013 02:22   Dg Abd Acute W/chest  12/29/2013   CLINICAL DATA:  Constipation, abdominal pain and nausea. Abdominal bloating.  EXAM: ACUTE ABDOMEN SERIES (ABDOMEN 2 VIEW & CHEST 1 VIEW)  COMPARISON:  None.  FINDINGS: The lungs are well-aerated and clear. There is no evidence of focal opacification, pleural effusion or pneumothorax. The cardiomediastinal silhouette is within normal limits.  The visualized bowel gas pattern is unremarkable. Scattered fluid and air are seen within the colon; there is no evidence of small bowel dilatation to suggest obstruction. No free intra-abdominal air is identified on the provided upright view.  No acute osseous abnormalities are seen; the sacroiliac joints are unremarkable in appearance.  IMPRESSION: 1. Unremarkable bowel gas pattern; no free intra-abdominal air seen. Fluid seen partially filling the colon. No significant stool seen in the colon. 2. No acute cardiopulmonary process seen.   Electronically Signed   By: Garald Balding M.D.   On: 12/29/2013 03:00   Mr Jodene Nam Head/brain Wo Cm  12/31/2013    CLINICAL DATA:  Left-sided numbness.  EXAM: MRI HEAD WITHOUT CONTRAST  MRA HEAD WITHOUT CONTRAST  TECHNIQUE: Multiplanar, multiecho pulse sequences of the brain and surrounding structures were obtained without intravenous contrast. Angiographic images of the head were obtained using MRA technique without contrast.  COMPARISON:  Head CT 12/30/2013  FINDINGS: MRI HEAD FINDINGS  Diffusion imaging does not show any acute or subacute infarction. The brainstem and cerebellum are normal. No abnormality of the thalami or basal ganglia. There are scattered punctate foci of T2 and FLAIR signal within the frontal and parietal white matter. This could represent an early manifestation of small vessel disease. The differential diagnosis includes migraine related foci and evidence of old closed head injury. No cortical or large vessel territory abnormality. No mass lesion, hemorrhage, hydrocephalus or extra-axial collection. No pituitary mass. No inflammatory sinus disease. Maxillary sinus retention  cysts are incidentally noted.  MRA HEAD FINDINGS  Both internal carotid arteries are widely patent into the brain. No siphon stenosis. There is an aneurysm at the left MCA bifurcation measuring approximately 4 mm in diameter. This may be bilobed.  Both vertebral arteries are widely patent to the basilar. No basilar stenosis. Posterior circulation branch vessels appear normal.  IMPRESSION: No evidence of acute or subacute infarction.  Scattered punctate white matter foci in the frontal and parietal lobes, not likely of acute relevance.  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 Presumably, this is a fortuitous diagnosis. Follow-up is warranted.   Electronically Signed   By: Nelson Chimes M.D.   On: 12/31/2013 12:51    Microbiology: No results found for this or any previous visit (from the past 240 hour(s)).   Labs: Basic Metabolic Panel:  Recent Labs Lab 12/30/13 2227 12/31/13 0343  NA 140 137  K 4.1 4.3  CL 102  104  CO2 20 13*  GLUCOSE 106* 78  BUN 14 15  CREATININE 1.20 0.98  CALCIUM 9.7 9.2   Liver Function Tests:  Recent Labs Lab 12/31/13 0343  AST 23  ALT 12  ALKPHOS 60  BILITOT 0.6  PROT 7.1  ALBUMIN 4.2   No results found for this basename: LIPASE, AMYLASE,  in the last 168 hours No results found for this basename: AMMONIA,  in the last 168 hours CBC:  Recent Labs Lab 12/30/13 2227 12/31/13 0343  WBC 10.4 8.5  NEUTROABS 6.8 4.9  HGB 14.4 13.5  HCT 41.8 40.1  MCV 69.7* 70.8*  PLT 269 250   Cardiac Enzymes:  Recent Labs Lab 12/30/13 2227 12/31/13 0343  TROPONINI <0.30 <0.30   BNP: BNP (last 3 results) No results found for this basename: PROBNP,  in the last 8760 hours CBG: No results found for this basename: GLUCAP,  in the last 168 hours     Signed:  Velvet Bathe  [facility] 01/01/2014, 5:42 PM

## 2014-01-01 NOTE — Progress Notes (Signed)
Echo Lab  2D Echocardiogram completed.  Glen Allen, RDCS 01/01/2014 11:30 AM

## 2014-01-02 ENCOUNTER — Other Ambulatory Visit (HOSPITAL_COMMUNITY): Payer: Self-pay | Admitting: Interventional Radiology

## 2014-01-02 ENCOUNTER — Ambulatory Visit (HOSPITAL_COMMUNITY)
Admission: RE | Admit: 2014-01-02 | Discharge: 2014-01-02 | Disposition: A | Discharge status: Home / Self Care | Payer: Medicaid Other | Source: Ambulatory Visit | Attending: Interventional Radiology | Admitting: Interventional Radiology

## 2014-01-02 DIAGNOSIS — I729 Aneurysm of unspecified site: Secondary | ICD-10-CM

## 2014-01-02 NOTE — Care Management Note (Signed)
    Page 1 of 1   01/02/2014     1:31:11 PM CARE MANAGEMENT NOTE 01/02/2014  Patient:  SELAH, ZELMAN   Account Number:  [id number]  Date Initiated:  01/02/2014  Documentation initiated by:  Lorne Skeens  Subjective/Objective Assessment:   Patient was admitted for chest pain, CVA symptoms. Lives at home with spouse     Action/Plan:   Will follow for discharge needs pending PT/OT evals and physician orders.   Anticipated DC Date:     Anticipated DC Plan:           Choice offered to / List presented to:             Status of service:  Completed, signed off Medicare Important Message given?   (If response is "NO", the following Medicare IM given date fields will be blank) Date Medicare IM given:   Medicare IM given by:   Date Additional Medicare IM given:   Additional Medicare IM given by:    Discharge Disposition:    Per UR Regulation:    If discussed at Long Length of Stay Meetings, dates discussed:    Comments:  01/02/14 [place] RN, MSN, CM- Patient was discharged after hours on 01/01/14.  At discharge, he was given orders/scripts for a 3n1 and outpatient therapy. Patient called nursing unit this morning to request prescriptions for a rolling walker and tub set, which he states he was supposed to get as well.  CM noted that these items were never recommended by therapy, nor ordered by the physician. Patient states that he has an appointment with a physician at 1245 today. CM suggested that patient try speaking with his physician to see if he feels prescriptions for these items are merited.

## 2014-01-16 ENCOUNTER — Other Ambulatory Visit (HOSPITAL_COMMUNITY): Payer: Self-pay | Admitting: Interventional Radiology

## 2014-01-16 DIAGNOSIS — I729 Aneurysm of unspecified site: Secondary | ICD-10-CM

## 2014-01-19 ENCOUNTER — Encounter (HOSPITAL_COMMUNITY): Payer: Self-pay | Admitting: Pharmacy Technician

## 2014-01-22 ENCOUNTER — Other Ambulatory Visit: Payer: Self-pay | Admitting: Radiology

## 2014-01-25 ENCOUNTER — Encounter (HOSPITAL_COMMUNITY)
Admission: RE | Admit: 2014-01-25 | Discharge: 2014-01-25 | Disposition: A | Discharge status: Home / Self Care | Payer: Medicaid Other | Source: Ambulatory Visit | Attending: Interventional Radiology | Admitting: Interventional Radiology

## 2014-01-25 ENCOUNTER — Encounter (HOSPITAL_COMMUNITY): Payer: Self-pay

## 2014-01-25 DIAGNOSIS — I671 Cerebral aneurysm, nonruptured: Secondary | ICD-10-CM | POA: Diagnosis not present

## 2014-01-25 DIAGNOSIS — Z87891 Personal history of nicotine dependence: Secondary | ICD-10-CM | POA: Diagnosis not present

## 2014-01-25 DIAGNOSIS — E785 Hyperlipidemia, unspecified: Secondary | ICD-10-CM | POA: Diagnosis not present

## 2014-01-25 DIAGNOSIS — G459 Transient cerebral ischemic attack, unspecified: Secondary | ICD-10-CM | POA: Insufficient documentation

## 2014-01-25 DIAGNOSIS — Z01818 Encounter for other preprocedural examination: Secondary | ICD-10-CM | POA: Insufficient documentation

## 2014-01-25 HISTORY — DX: Hyperlipidemia, unspecified: E78.5

## 2014-01-25 HISTORY — DX: Headache, unspecified: R51.9

## 2014-01-25 HISTORY — DX: Transient cerebral ischemic attack, unspecified: G45.9

## 2014-01-25 HISTORY — DX: Anxiety disorder, unspecified: F41.9

## 2014-01-25 HISTORY — DX: Headache: R51

## 2014-01-25 LAB — CBC WITH DIFFERENTIAL/PLATELET
Basophils Absolute: 0 10*3/uL (ref 0.0–0.1)
Basophils Relative: 0 % (ref 0–1)
Eosinophils Absolute: 0.1 10*3/uL (ref 0.0–0.7)
Eosinophils Relative: 1 % (ref 0–5)
HCT: 38.2 % — ABNORMAL LOW (ref 39.0–52.0)
Hemoglobin: 12.6 g/dL — ABNORMAL LOW (ref 13.0–17.0)
Lymphocytes Relative: 22 % (ref 12–46)
Lymphs Abs: 2.2 10*3/uL (ref 0.7–4.0)
MCH: 23.8 pg — ABNORMAL LOW (ref 26.0–34.0)
MCHC: 33 g/dL (ref 30.0–36.0)
MCV: 72.1 fL — ABNORMAL LOW (ref 78.0–100.0)
MONOS PCT: 5 % (ref 3–12)
Monocytes Absolute: 0.5 10*3/uL (ref 0.1–1.0)
NEUTROS PCT: 72 % (ref 43–77)
Neutro Abs: 7.1 10*3/uL (ref 1.7–7.7)
PLATELETS: 251 10*3/uL (ref 150–400)
RBC: 5.3 MIL/uL (ref 4.22–5.81)
RDW: 14.8 % (ref 11.5–15.5)
WBC: 9.9 10*3/uL (ref 4.0–10.5)

## 2014-01-25 LAB — COMPREHENSIVE METABOLIC PANEL
ALBUMIN: 4 g/dL (ref 3.5–5.2)
ALT: 45 U/L (ref 0–53)
AST: 23 U/L (ref 0–37)
Alkaline Phosphatase: 50 U/L (ref 39–117)
Anion gap: 14 (ref 5–15)
BILIRUBIN TOTAL: 0.3 mg/dL (ref 0.3–1.2)
BUN: 13 mg/dL (ref 6–23)
CALCIUM: 9.2 mg/dL (ref 8.4–10.5)
CHLORIDE: 106 meq/L (ref 96–112)
CO2: 22 meq/L (ref 19–32)
Creatinine, Ser: 1.19 mg/dL (ref 0.50–1.35)
GFR calc Af Amer: 81 mL/min — ABNORMAL LOW (ref >90)
GFR, EST NON AFRICAN AMERICAN: 70 mL/min — AB (ref >90)
Glucose, Bld: 87 mg/dL (ref 70–99)
Potassium: 4 mEq/L (ref 3.7–5.3)
SODIUM: 142 meq/L (ref 137–147)
Total Protein: 7.2 g/dL (ref 6.0–8.3)

## 2014-01-25 LAB — PROTIME-INR
INR: 1.11 (ref 0.00–1.49)
Prothrombin Time: 14.4 seconds (ref 11.6–15.2)

## 2014-01-25 LAB — APTT: APTT: 32 s (ref 24–37)

## 2014-01-25 NOTE — Progress Notes (Signed)
Followed by pain clinic No cardiologist ekg and chest xray in epic  Starting plavix 01/26/14

## 2014-01-25 NOTE — Progress Notes (Signed)
Anesthesia Chart Review:  Patient is a 48 year old male scheduled for cerebral arteriogram with possible embolization of aneurysm on 01/31/14 by Dr. Estanislado Pandy. He was found to have a left MCA bifurcation bilobed 4 mm aneurysm during a 12/30/13 hospitalization for left sided numbness, possible TIA (MRI negative for CVA).  Other history includes former smoker, HLD, anxiety, reflux, DDD, esophageal cancer s/p radiation. He reported being instructed to start Plavix on 01/26/14.   Echo on 01/01/14 showed: Normal LV wall thickness and chamber size with LVEF 55-60%, no regional wall motion abnormalities, normal diastolic function. Mildly thickened mitral leaflets. Trivial tricuspid regurgitation. No obvious PFO or ASD. Unable to assess PASP.  EKG on 12/30/13 showed: NSR, sinus arrhythmia, non-specific ST/T wave abnormality.  Carotid duplex on 01/01/14 showed: Bilateral: intimal wall thickening CCA. 1-39% ICA stenosis. Vertebral artery flow is antegrade.  Preoperative labs noted.  He is for p2y12 on the day of surgery.   If p2y12 result acceptable and otherwise no acute changes then I would anticipate that he could proceed as planned.  He has a history of radiation to his neck region, but no reported difficult intubation history. Further evaluation by his assigned anesthesiologist on the day of surgery to discuss the definitive anesthesia plan.  Bradley Brock [facility] Short Stay Center/Anesthesiology Phone [phone] 01/25/2014 4:41 PM

## 2014-01-25 NOTE — Pre-Procedure Instructions (Signed)
Najib Colmenares  01/25/2014   Your procedure is scheduled on:  Wednesday, October 21st  Report to [facility] Admitting at 630 AM.  Call this number if you have problems the morning of surgery: [phone]   Remember:   Do not eat food or drink liquids after midnight.   Take these medicines the morning of surgery with A SIP OF WATER: aspirin, plavix, prilosec, percocet if needed, xanax, flonase   Do not wear jewelry.  Do not wear lotions, powders, or perfumes. You may wear deodorant.  Do not shave 48 hours prior to surgery. Men may shave face and neck.  Do not bring valuables to the hospital.  [facility] is not responsible  for any belongings or valuables.               Contacts, dentures or bridgework may not be worn into surgery.  Leave suitcase in the car. After surgery it may be brought to your room.  For patients admitted to the hospital, discharge time is determined by your  treatment team.               Patients discharged the day of surgery will not be allowed to drive home.  Please read over the following fact sheets that you were given: Pain Booklet, Coughing and Deep Breathing and Surgical Site Infection Prevention  - Preparing for Surgery  Before surgery, you can play an important role.  Because skin is not sterile, your skin needs to be as free of germs as possible.  You can reduce the number of germs on you skin by washing with CHG (chlorahexidine gluconate) soap before surgery.  CHG is an antiseptic cleaner which kills germs and bonds with the skin to continue killing germs even after washing.  Please DO NOT use if you have an allergy to CHG or antibacterial soaps.  If your skin becomes reddened/irritated stop using the CHG and inform your nurse when you arrive at Short Stay.  Do not shave (including legs and underarms) for at least 48 hours prior to the first CHG shower.  You may shave your face.  Please follow these instructions carefully:   1.   Shower with CHG Soap the night before surgery and the morning of Surgery.  2.  If you choose to wash your hair, wash your hair first as usual with your normal shampoo.  3.  After you shampoo, rinse your hair and body thoroughly to remove the shampoo.  4.  Use CHG as you would any other liquid soap.  You can apply CHG directly to the skin and wash gently with scrungie or a clean washcloth.  5.  Apply the CHG Soap to your body ONLY FROM THE NECK DOWN.  Do not use on open wounds or open sores.  Avoid contact with your eyes, ears, mouth and genitals (private parts).  Wash genitals (private parts) with your normal soap.  6.  Wash thoroughly, paying special attention to the area where your surgery will be performed.  7.  Thoroughly rinse your body with warm water from the neck down.  8.  DO NOT shower/wash with your normal soap after using and rinsing off the CHG Soap.  9.  Pat yourself dry with a clean towel.            10.  Wear clean pajamas.            11.  Place clean sheets on your bed the night of your  first shower and do not sleep with pets.  Day of Surgery  Do not apply any lotions/deoderants the morning of surgery.  Please wear clean clothes to the hospital/surgery center.

## 2014-01-30 ENCOUNTER — Other Ambulatory Visit: Payer: Self-pay | Admitting: Radiology

## 2014-01-31 ENCOUNTER — Encounter (HOSPITAL_COMMUNITY): Payer: Self-pay | Admitting: Anesthesiology

## 2014-01-31 ENCOUNTER — Encounter (HOSPITAL_COMMUNITY): Payer: Self-pay

## 2014-01-31 ENCOUNTER — Ambulatory Visit (HOSPITAL_COMMUNITY)
Admission: RE | Admit: 2014-01-31 | Discharge: 2014-01-31 | Disposition: A | Discharge status: Home / Self Care | Payer: Medicaid Other | Source: Ambulatory Visit | Attending: Interventional Radiology | Admitting: Interventional Radiology

## 2014-01-31 ENCOUNTER — Ambulatory Visit (HOSPITAL_COMMUNITY): Payer: Medicaid Other | Admitting: Anesthesiology

## 2014-01-31 ENCOUNTER — Inpatient Hospital Stay (HOSPITAL_COMMUNITY)
Admission: RE | Admit: 2014-01-31 | Discharge: 2014-02-01 | DRG: 027 | Disposition: A | Discharge status: Home / Self Care | Payer: Medicaid Other | Source: Ambulatory Visit | Attending: Interventional Radiology | Admitting: Interventional Radiology

## 2014-01-31 ENCOUNTER — Encounter (HOSPITAL_COMMUNITY): Payer: Medicaid Other | Admitting: Vascular Surgery

## 2014-01-31 ENCOUNTER — Encounter (HOSPITAL_COMMUNITY)
Admission: RE | Disposition: A | Discharge status: Home / Self Care | Payer: Self-pay | Source: Ambulatory Visit | Attending: Interventional Radiology

## 2014-01-31 VITALS — BP 127/75 | HR 76 | Resp 20 | Ht 69.0 in | Wt 168.0 lb

## 2014-01-31 DIAGNOSIS — I671 Cerebral aneurysm, nonruptured: Principal | ICD-10-CM | POA: Diagnosis present

## 2014-01-31 DIAGNOSIS — Z7902 Long term (current) use of antithrombotics/antiplatelets: Secondary | ICD-10-CM

## 2014-01-31 DIAGNOSIS — Z79899 Other long term (current) drug therapy: Secondary | ICD-10-CM

## 2014-01-31 DIAGNOSIS — I729 Aneurysm of unspecified site: Secondary | ICD-10-CM

## 2014-01-31 DIAGNOSIS — Z7982 Long term (current) use of aspirin: Secondary | ICD-10-CM | POA: Diagnosis not present

## 2014-01-31 HISTORY — PX: RADIOLOGY WITH ANESTHESIA: SHX6223

## 2014-01-31 LAB — PLATELET INHIBITION P2Y12: Platelet Function  P2Y12: 176 [PRU] — ABNORMAL LOW (ref 194–418)

## 2014-01-31 LAB — POCT ACTIVATED CLOTTING TIME
ACTIVATED CLOTTING TIME: 163 s
Activated Clotting Time: 123 seconds
Activated Clotting Time: 140 seconds
Activated Clotting Time: 152 seconds

## 2014-01-31 LAB — HEPARIN LEVEL (UNFRACTIONATED): Heparin Unfractionated: <0.1 IU/mL — ABNORMAL LOW (ref 0.30–0.70)

## 2014-01-31 SURGERY — RADIOLOGY WITH ANESTHESIA
Anesthesia: Monitor Anesthesia Care

## 2014-01-31 MED ORDER — PROPOFOL 10 MG/ML IV BOLUS
INTRAVENOUS | Status: DC | PRN
  Administered 2014-01-31: 200 mg via INTRAVENOUS

## 2014-01-31 MED ORDER — NICARDIPINE HCL IN NACL 20-0.86 MG/200ML-% IV SOLN
INTRAVENOUS | Status: DC | PRN
  Administered 2014-01-31: 1.5 mg/h via INTRAVENOUS

## 2014-01-31 MED ORDER — HYDROMORPHONE HCL 1 MG/ML IJ SOLN
0.2500 mg | INTRAMUSCULAR | Status: DC | PRN
  Administered 2014-01-31 (×2): 0.5 mg via INTRAVENOUS

## 2014-01-31 MED ORDER — LIDOCAINE HCL 1 % IJ SOLN
INTRAMUSCULAR | Status: AC
  Filled 2014-01-31: qty 20

## 2014-01-31 MED ORDER — ONDANSETRON HCL 4 MG/2ML IJ SOLN: 4.0000 mg | Freq: Four times a day (QID) | INTRAMUSCULAR | Status: DC | PRN

## 2014-01-31 MED ORDER — ASPIRIN EC 325 MG PO TBEC: 325.0000 mg | DELAYED_RELEASE_TABLET | Freq: Once | ORAL | Status: DC

## 2014-01-31 MED ORDER — OXYCODONE HCL 5 MG/5ML PO SOLN: 5.0000 mg | Freq: Once | ORAL | Status: AC | PRN

## 2014-01-31 MED ORDER — FENTANYL CITRATE 0.05 MG/ML IJ SOLN
INTRAMUSCULAR | Status: DC | PRN
  Administered 2014-01-31: 200 ug via INTRAVENOUS
  Administered 2014-01-31: 50 ug via INTRAVENOUS

## 2014-01-31 MED ORDER — SODIUM CHLORIDE 0.9 % IV SOLN: Freq: Once | INTRAVENOUS | Status: DC

## 2014-01-31 MED ORDER — MEPERIDINE HCL 25 MG/ML IJ SOLN: 6.2500 mg | INTRAMUSCULAR | Status: DC | PRN

## 2014-01-31 MED ORDER — SODIUM CHLORIDE 0.9 % IV SOLN
INTRAVENOUS | Status: DC | PRN
  Administered 2014-01-31: 14:00:00 via INTRAVENOUS

## 2014-01-31 MED ORDER — MIDAZOLAM HCL 5 MG/5ML IJ SOLN
INTRAMUSCULAR | Status: DC | PRN
  Administered 2014-01-31: 1 mg via INTRAVENOUS

## 2014-01-31 MED ORDER — ONDANSETRON HCL 4 MG/2ML IJ SOLN
INTRAMUSCULAR | Status: DC | PRN
  Administered 2014-01-31: 4 mg via INTRAVENOUS

## 2014-01-31 MED ORDER — NIMODIPINE 30 MG PO CAPS
ORAL_CAPSULE | ORAL | Status: AC
  Filled 2014-01-31: qty 2

## 2014-01-31 MED ORDER — ASPIRIN 325 MG PO TABS
325.0000 mg | ORAL_TABLET | Freq: Every day | ORAL | Status: DC
Start: 2014-02-01 — End: 2014-02-01
  Administered 2014-02-01: 325 mg via ORAL
  Filled 2014-01-31 (×2): qty 1

## 2014-01-31 MED ORDER — LIDOCAINE HCL (CARDIAC) 20 MG/ML IV SOLN
INTRAVENOUS | Status: DC | PRN
  Administered 2014-01-31: 100 mg via INTRAVENOUS

## 2014-01-31 MED ORDER — CEFAZOLIN SODIUM-DEXTROSE 2-3 GM-% IV SOLR
INTRAVENOUS | Status: AC
Start: 2014-01-31 — End: 2014-01-31
  Filled 2014-01-31: qty 50

## 2014-01-31 MED ORDER — NITROGLYCERIN 1 MG/10 ML FOR IR/CATH LAB
INTRA_ARTERIAL | Status: AC
  Filled 2014-01-31: qty 10

## 2014-01-31 MED ORDER — NICARDIPINE HCL IN NACL 20-0.86 MG/200ML-% IV SOLN
5.0000 mg/h | INTRAVENOUS | Status: AC
  Filled 2014-01-31: qty 200

## 2014-01-31 MED ORDER — OXYCODONE HCL 5 MG PO TABS
5.0000 mg | ORAL_TABLET | Freq: Once | ORAL | Status: AC | PRN
  Administered 2014-01-31: 5 mg via ORAL

## 2014-01-31 MED ORDER — OXYCODONE HCL 5 MG PO TABS
ORAL_TABLET | ORAL | Status: AC
  Filled 2014-01-31: qty 1

## 2014-01-31 MED ORDER — ACETAMINOPHEN 325 MG PO TABS
650.0000 mg | ORAL_TABLET | Freq: Four times a day (QID) | ORAL | Status: DC | PRN
  Administered 2014-01-31: 650 mg via ORAL
  Filled 2014-01-31: qty 2

## 2014-01-31 MED ORDER — LACTATED RINGERS IV SOLN
INTRAVENOUS | Status: DC | PRN
  Administered 2014-01-31 (×2): via INTRAVENOUS

## 2014-01-31 MED ORDER — CLOPIDOGREL BISULFATE 75 MG PO TABS
75.0000 mg | ORAL_TABLET | Freq: Once | ORAL | Status: DC
Start: 2014-01-31 — End: 2014-01-31

## 2014-01-31 MED ORDER — ROCURONIUM BROMIDE 100 MG/10ML IV SOLN
INTRAVENOUS | Status: DC | PRN
  Administered 2014-01-31: 50 mg via INTRAVENOUS

## 2014-01-31 MED ORDER — HYDROMORPHONE HCL 1 MG/ML IJ SOLN
INTRAMUSCULAR | Status: AC
  Administered 2014-01-31: 0.5 mg via INTRAVENOUS
  Filled 2014-01-31: qty 1

## 2014-01-31 MED ORDER — CEFAZOLIN SODIUM-DEXTROSE 2-3 GM-% IV SOLR
2.0000 g | Freq: Once | INTRAVENOUS | Status: AC
  Administered 2014-01-31: 2 g via INTRAVENOUS

## 2014-01-31 MED ORDER — ONDANSETRON HCL 4 MG/2ML IJ SOLN: 4.0000 mg | Freq: Once | INTRAMUSCULAR | Status: DC | PRN

## 2014-01-31 MED ORDER — IOHEXOL 300 MG/ML  SOLN
150.0000 mL | Freq: Once | INTRAMUSCULAR | Status: AC | PRN
  Administered 2014-01-31: 130 mL via INTRA_ARTERIAL

## 2014-01-31 MED ORDER — SODIUM CHLORIDE 0.9 % IV SOLN
INTRAVENOUS | Status: DC
  Administered 2014-01-31 – 2014-02-01 (×2): via INTRAVENOUS

## 2014-01-31 MED ORDER — HEPARIN SODIUM (PORCINE) 1000 UNIT/ML IJ SOLN
INTRAMUSCULAR | Status: DC | PRN
  Administered 2014-01-31: 2000 [IU] via INTRAVENOUS
  Administered 2014-01-31 (×2): 500 [IU] via INTRAVENOUS
  Administered 2014-01-31: 1000 [IU] via INTRAVENOUS

## 2014-01-31 MED ORDER — HEPARIN (PORCINE) IN NACL 100-0.45 UNIT/ML-% IJ SOLN
INTRAMUSCULAR | Status: AC
  Filled 2014-01-31: qty 250

## 2014-01-31 MED ORDER — HEPARIN (PORCINE) IN NACL 100-0.45 UNIT/ML-% IJ SOLN
750.0000 [IU]/h | INTRAMUSCULAR | Status: DC
  Administered 2014-01-31: 600 [IU]/h via INTRAVENOUS
  Filled 2014-01-31 (×2): qty 250

## 2014-01-31 MED ORDER — NIMODIPINE 30 MG PO CAPS
60.0000 mg | ORAL_CAPSULE | ORAL | Status: AC
  Administered 2014-01-31: 60 mg via ORAL

## 2014-01-31 MED ORDER — CLOPIDOGREL BISULFATE 75 MG PO TABS
75.0000 mg | ORAL_TABLET | Freq: Every day | ORAL | Status: DC
  Administered 2014-02-01: 75 mg via ORAL
  Filled 2014-01-31 (×2): qty 1

## 2014-01-31 NOTE — Progress Notes (Signed)
ANTICOAGULATION CONSULT NOTE - Initial Consult  Pharmacy Consult for heparin Indication: s/p cerebal angiogram  Allergies  Allergen Reactions  . Vicodin [Hydrocodone-Acetaminophen] Itching  . Tape Rash    Patient Measurements:   Heparin Dosing Weight: 76.2kg  Vital Signs: Temp Source: Oral (10/21 0713) BP: 127/75 mmHg (10/21 0713) Pulse Rate: 76 (10/21 0713)  Labs: No results found for this basename: HGB, HCT, PLT, APTT, LABPROT, INR, HEPARINUNFRC, CREATININE, CKTOTAL, CKMB, TROPONINI,  in the last 72 hours  The CrCl is unknown because both a height and weight (above a minimum accepted value) are required for this calculation.   Medical History: Past Medical History  Diagnosis Date  . DDD (degenerative disc disease), cervical   . Acid reflux   . Anxiety   . TIA (transient ischemic attack) 12/30/2013  . 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 Hyperlipemia   . Headache     Medications:  Infusions:  . sodium chloride    . heparin    . niCARDipine      Assessment: 38 yom s/p cerebral angiogram to start IV heparin. Baseline CBC is WNL.   Goal of Therapy:  Heparin level 0.1-0.25 units/ml Monitor platelets by anticoagulation protocol: Yes   Plan:  1. Heparin gtt 600 units/hr 2. Check an 8 hour heparin level 3. Heparin level and CBC in AM, heparin off at [place], Bradley Brock 01/31/2014,1:35 PM

## 2014-01-31 NOTE — Anesthesia Preprocedure Evaluation (Signed)
Anesthesia Evaluation  Patient identified by MRN, date of birth, ID band Patient awake    Reviewed: Allergy & Precautions, H&P , NPO status , Patient's Chart, lab work & pertinent test results  Airway Mallampati: I TM Distance: >3 FB Neck ROM: Full    Dental   Pulmonary former smoker,          Cardiovascular + Peripheral Vascular Disease     Neuro/Psych Anxiety    GI/Hepatic GERD-  Controlled,  Endo/Other    Renal/GU      Musculoskeletal   Abdominal   Peds  Hematology   Anesthesia Other Findings   Reproductive/Obstetrics                           Anesthesia Physical Anesthesia Plan  ASA: III  Anesthesia Plan: General and MAC   Post-op Pain Management:    Induction: Intravenous  Airway Management Planned: Oral ETT  Additional Equipment: Arterial line  Intra-op Plan:   Post-operative Plan: Possible Post-op intubation/ventilation  Informed Consent: I have reviewed the patients History and Physical, chart, labs and discussed the procedure including the risks, benefits and alternatives for the proposed anesthesia with the patient or authorized representative who has indicated his/her understanding and acceptance.     Plan Discussed with: CRNA and Surgeon  Anesthesia Plan Comments:         Anesthesia Quick Evaluation

## 2014-01-31 NOTE — Progress Notes (Signed)
Following bp cuff pressures per CRNA

## 2014-01-31 NOTE — Progress Notes (Signed)
  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 He is extubated and denies any headache, extremity weakness or numbness. He denies any N/V, or vision changes.   Allergies: Vicodin and Tape  Medications: Prior to Admission medications   Medication Sig Start Date End Date Taking? Authorizing Provider  ALPRAZolam (XANAX) 0.25 MG tablet Take 0.25 mg by mouth 2 (two) times daily.   Yes Historical Provider, MD  aspirin 325 MG tablet Take 1 tablet (325 mg total) by mouth daily. 01/01/14  Yes Velvet Bathe, MD  clopidogrel (PLAVIX) 75 MG tablet Take 75 mg by mouth daily.   Yes Historical Provider, MD  fluticasone (FLONASE) 50 MCG/ACT nasal spray Place 1 spray into both nostrils daily.    Yes Historical Provider, MD  megestrol (MEGACE) 40 MG/ML suspension Take 200 mg by mouth daily.    Yes Historical Provider, MD  omeprazole (PRILOSEC) 20 MG capsule Take 20 mg by mouth daily.   Yes Historical Provider, MD  oxyCODONE-acetaminophen (PERCOCET) 10-325 MG per tablet Take 1 tablet by mouth every 6 (six) hours as needed for pain.   Yes Historical Provider, MD  simvastatin (ZOCOR) 20 MG tablet Take 20 mg by mouth at bedtime.   Yes Historical Provider, MD  sodium chloride (OCEAN) 0.65 % SOLN nasal spray Place 1 spray into both nostrils daily.    Yes Historical Provider, MD    Review of Systems  Vital Signs: BP 112/77  Pulse 65  Temp(Src) 98.2 F (36.8 C)  Resp 13  SpO2 100%  Physical Exam General: A&Ox3, NAD, Extubated Abd: Soft, NT, ND Ext: Warm B/L, Right groin site dressing C/D/I, minimal tenderness to palpation, no signs of bleeding or hematoma, DP 2+ B/L Neuro: Speech clear, smile symmetrical, EOMI, PERRLA, tongue midline, equal strength upper and lower extremities 5/5, able to whistle  Imaging: No results found.  Labs:  CBC:  Recent Labs  12/30/13 2227 12/31/13 0343  01/25/14 1330  WBC 10.4 8.5 9.9  HGB 14.4 13.5 12.6*  HCT 41.8 40.1 38.2*  PLT 269 250 251    COAGS:  Recent Labs  12/30/13 2227 01/25/14 1330  INR 1.03 1.11  APTT 32 32    BMP:  Recent Labs  12/30/13 2227 12/31/13 0343 01/25/14 1330  NA 140 137 142  K 4.1 4.3 4.0  CL 102 104 106  CO2 20 13* 22  GLUCOSE 106* 78 87  BUN 14 15 13   CALCIUM 9.7 9.2 9.2  CREATININE 1.20 0.98 1.19  GFRNONAA 69* >90 70*  GFRAA 80* >90 81*    LIVER FUNCTION TESTS:  Recent Labs  12/31/13 0343 01/25/14 1330  BILITOT 0.6 0.3  AST 23 23  ALT 12 45  ALKPHOS 60 50  PROT 7.1 7.2  ALBUMIN 4.2 4.0    Assessment and Plan: S/p 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    SignedHedy Jacob 01/31/2014, 4:42 PM

## 2014-01-31 NOTE — Procedures (Signed)
S/P 4 vessel cerebral arteriogram. RT CFA approach. Findings. 1.4.53mmx 3.5 mm irregular bilobed  Lt MCA bifurcation aneurysm, with complete obliteration usingcoils with LVIS JR  stent assistance.

## 2014-01-31 NOTE — Progress Notes (Signed)
[facility] Progress Note Patient Name: Bradley Brock DOB: [birth year] MRN: [id number]   Date of Service  01/31/2014  HPI/Events of Note  Elective angiogram & coiling of lt MCA aneurysm, TIA 12/2013, required transient cardene gtt, now BP ok, on IV heparin  eICU Interventions  Monitor BP   New ICU patient evaluation: This patient was evaluated by the [facility] team. I have reviewed relevant documentation including care plan & orders.   Intervention Category Evaluation Type: New Patient Evaluation  ALVA,RAKESH V. 01/31/2014, 9:20 PM

## 2014-01-31 NOTE — Anesthesia Postprocedure Evaluation (Signed)
  Anesthesia Post-op Note  Patient: Bradley Brock  Procedure(s) Performed: Procedure(s): RADIOLOGY WITH ANESTHESIA EMBLOZIATION (N/A)  Patient Location: PACU  Anesthesia Type:General  Level of Consciousness: awake and alert   Airway and Oxygen Therapy: Patient Spontanous Breathing  Post-op Pain: none  Post-op Assessment: Post-op Vital signs reviewed, Patient's Cardiovascular Status Stable and Respiratory Function Stable  Post-op Vital Signs: Reviewed  Filed Vitals:   01/31/14 1415  BP:   Pulse: 74  Temp:   Resp: 12    Complications: No apparent anesthesia complications

## 2014-01-31 NOTE — Progress Notes (Addendum)
ANTICOAGULATION CONSULT NOTE - Initial Consult  Pharmacy Consult for heparin Indication: s/p cerebal angiogram  Allergies  Allergen Reactions  . Vicodin [Hydrocodone-Acetaminophen] Itching  . Tape Rash    Patient Measurements: Height: 5\' 9"  (175.3 cm) Weight: 167 lb 15.9 oz (76.2 kg) IBW/kg (Calculated) : 70.7 Heparin Dosing Weight: 76.2kg  Vital Signs: Temp: 98.3 F (36.8 C) (10/21 2317) Temp Source: Oral (10/21 2317) BP: 110/73 mmHg (10/21 2200) Pulse Rate: 81 (10/21 2200)  Labs:  Recent Labs  01/31/14 2210  HEPARINUNFRC <0.10*    Estimated Creatinine Clearance: 75.1 ml/min (by C-G formula based on Cr of 1.19).   Medical History: Past Medical History  Diagnosis Date  . DDD (degenerative disc disease), cervical   . Acid reflux   . Anxiety   . TIA (transient ischemic attack) 12/30/2013  . 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 Hyperlipemia   . Headache     Medications:  Infusions:  . sodium chloride    . heparin 600 Units/hr (01/31/14 1345)  . niCARDipine Stopped (01/31/14 1443)    Assessment: 24 yom s/p cerebral angiogram to start IV heparin. Baseline CBC is WNL. HL is undetectable at 600 units/hr. No overt bleeding noted.   Goal of Therapy:  Heparin level 0.1-0.25 units/ml Monitor platelets by anticoagulation protocol: Yes   Plan:  Increase Heparin to 750  units/hr until off at 0700  Curlene Dolphin

## 2014-01-31 NOTE — Progress Notes (Signed)
Pt states that he feels like he has some food stuck in his throat on the left side and believes it was some chicken salad/possibly a piece of bone.  States is not affecting his respiratory status at this time. Instructed pt to mention this to anesthesia when they come to see him.

## 2014-01-31 NOTE — H&P (Signed)
Chief Complaint: "I am here for a procedure for my brain aneurysm."  History of Present Illness: Bradley Brock is a 48 y.o. male who presented on 12/30/13 with left sided weakness and numbness and MRI/MRA performed revealed 4 mm bilobed aneurysm at the left MCA bifurcation. The patient's symptoms resolved and he was seen in consultation with Dr. Estanislado Pandy on 01/03/14 and scheduled today for a cerebral diagnostic arteriogram with possible intervention. He denies any active weakness or numbness, denies any difficulty with speech or new neurological changes. He does admit last week his cousin had a cerebral aneurysm rupture and required surgical intervention. He denies any chest pain, shortness of breath or palpitations. He denies any active signs of bleeding or excessive bruising. He denies any recent fever or chills. The patient denies any history of sleep apnea or chronic oxygen use. He has previously tolerated anesthesia without complications.     Past Medical History  Diagnosis Date  . DDD (degenerative disc disease), cervical   . Acid reflux   . Anxiety   . TIA (transient ischemic attack) 12/30/2013  . 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 Hyperlipemia   . Headache     Past Surgical History  Procedure Laterality Date  . Biopsy pharynx      esophageal    Allergies: Vicodin and Tape  Medications: Prior to Admission medications   Medication Sig Start Date End Date Taking? Authorizing Provider  ALPRAZolam (XANAX) 0.25 MG tablet Take 0.25 mg by mouth 2 (two) times daily.   Yes Historical Provider, MD  aspirin 325 MG tablet Take 1 tablet (325 mg total) by mouth daily. 01/01/14  Yes Velvet Bathe, MD  fluticasone (FLONASE) 50 MCG/ACT nasal spray Place 1 spray into both nostrils daily.    Yes Historical Provider, MD  omeprazole (PRILOSEC) 20 MG capsule Take 20 mg by mouth daily.   Yes Historical Provider, MD  oxyCODONE-acetaminophen (PERCOCET) 10-325 MG per tablet Take 1  tablet by mouth every 6 (six) hours as needed for pain.   Yes Historical Provider, MD  simvastatin (ZOCOR) 20 MG tablet Take 20 mg by mouth at bedtime.   Yes Historical Provider, MD  sodium chloride (OCEAN) 0.65 % SOLN nasal spray Place 1 spray into both nostrils daily.    Yes Historical Provider, MD  clopidogrel (PLAVIX) 75 MG tablet Take 75 mg by mouth daily.    Historical Provider, MD  megestrol (MEGACE) 40 MG/ML suspension Take 200 mg by mouth daily.     Historical Provider, MD    Family History  Problem Relation Age of Onset  . Cancer Mother   . Stomach cancer Brother   . Testicular cancer Brother     History   Social History  . Marital Status: Married    Spouse Name: N/A    Number of Children: N/A  . Years of Education: N/A   Social History Main Topics  . Smoking status: Former Research scientist (life sciences)  . Smokeless tobacco: Former Systems developer    Quit date: 04/13/2013  . Alcohol Use: No  . Drug Use: No     Comment: Quit x4-5 days  . Sexual Activity: None   Other Topics Concern  . None   Social History Narrative  . None   Review of Systems: A 12 point ROS discussed and pertinent positives are indicated in the HPI above.  All other systems are negative.  Review of Systems  Vital Signs: BP 127/75  Pulse 76  Resp  20  Ht 5\' 9"  (1.753 m)  Wt 168 lb (76.204 kg)  BMI 24.80 kg/m2  SpO2 100%  Physical Exam  Constitutional: He is oriented to person, place, and time. He appears well-developed and well-nourished. No distress.  Neck: No tracheal deviation present.  Cardiovascular: Normal rate, regular rhythm and intact distal pulses.  Exam reveals no gallop and no friction rub.   No murmur heard. DP 2+ bilaterally  Pulmonary/Chest: Effort normal and breath sounds normal. No respiratory distress. He has no wheezes. He has no rales.  Abdominal: Soft. Bowel sounds are normal. He exhibits no distension. There is no tenderness.  Neurological: He is alert and oriented to person, place, and time.    Equal strength upper and lower extremities B/L 5/5, smile symmetrical, tongue midline, speech clear, able to whistle, fine motor intact, no ataxia.   Skin: Skin is warm and dry. He is not diaphoretic.  Psychiatric: He has a normal mood and affect. His behavior is normal. Thought content normal.    Imaging: Ir Radiologist Eval & Mgmt  01/03/2014   EXAM: NEW PATIENT OFFICE VISIT  CHIEF COMPLAINT: Left-sided numbness and weakness improving. Discovery of intracranial aneurysm.  Current Pain Level: 1-10  HISTORY OF PRESENT ILLNESS: The patient is a 48 year old right handed gentleman who was recently discharged from the hospital following evaluation for left-sided numbness and weakness approximately 3 days ago.  The patient underwent evaluation in the form of MRI/MRA. The MRI did not reveal an ischemic lesion on the DVI images.  However, the MRA revealed an approximately 4.5 mm left MCA bifurcation region aneurysm.  The patient has been referred by neurology for evaluation of the 4.5 mm bilobed aneurysm regarding management.  The patient is accompanied today by his spouse and his son.  The patient reports no history of unusual headaches, nausea, vomiting, visual symptoms, gait difficulties or speech difficulties prior to his present admission.  Subsequent to his ischemic stroke at this time, he is improved in terms of his left left-sided facial numbness and weakness and left upper extremity numbness and weakness. He does continue to have what he describes as his left leg being weak in its entirety intermittent requiring assistance. The patient is scheduled for outpatient physical therapy in this regard.  Medications: The patient was discharged on aspirin, Nexium. Oxycodone for pain. Simvastatin for cholesterol regulation.  Past Medical History: Throat cancer status post radiation treatment. Patient is scheduled to see his radiation oncologist and his ENT doctor in next couple of months. High cholesterol. Stroke  as described above.  The patient also revealed a history of cervical degenerative disc disease undergoing injections for pain management.  Past surgeries:  None.  Allergies: No known.  Social History: Patient is a retired Administrator. He has 6 children. He stopped smoking a few months ago. Otherwise, he used to smoke up to 2 packs a day. Denies any use of illicit chemicals. Drink occasionally.  Family History: Strong history of cancer in the family. Brain tumor.  REVIEW OF SYSTEMS: Essentially negative for pathologic symptomatology.  PHYSICAL EXAMINATION: Briefly the patient is anxious with normal affect. Responses were appropriate.  Neurologically alert, awake and oriented to time, place, space. Speech and comprehension normal.  No cranial nerve abnormalities detected grossly.  Left lower extremity 4/5 power.  ASSESSMENT AND PLAN: The patient's recent MRI and MRA of the brain were reviewed. The MRA depicts an approximately 4.3-4.5 mm saccular aneurysm of the left MCA bifurcation region.  The remaining vessels appear widely  patent.  The natural history of intracranial aneurysms unruptured was discussed in detail.  Risks of rupture of 1-2% per year estimated. Risk of rupture increased in patient's with tobacco use, high cholesterol and hypertension.  Potential morbidity and mortality in the face of rupture also discussed in detail.  Mortality of 50% with rupture reviewed.  Options of management considered were those of continued surveillance with MRI scan of the brain and MRA of the brain versus consideration for treatment of the aneurysm to eliminate the risk of rupture. Option of endovascular treatment versus surgical clipping was reviewed.  The patient did not want to have the surgical clipping option. The endovascular option was discussed in detail including the treatment methods of primary coiling versus stent assisted coiling versus stage coiling and stenting. Risks of the procedure of 1% of  rupture/thromboembolic stroke. In case of intraprocedural rupture, the need for emergent neurosurgical intervention and death were reviewed.  The patient has expressed a desire to proceed with diagnostic angiogram with endovascular treatment as described above following the diagnostic arteriogram. The patient has been informed to complete his physical therapy treatment of his left lower extremity weakness for 3 weeks. Thereafter, the patient will be started on Plavix 75 mg a day in addition to the aspirin he is already taking. Diagnostic arteriogram with endovascular treatment with anesthesia will be scheduled just after 3 weeks from now. The patient and his family leave with a good understanding and agreement with the above management plan. There were asked to call should they have any concerns or questions.   Electronically Signed   By: Luanne Bras M.D.   On: 01/02/2014 14:40    Labs:  CBC:  Recent Labs  12/30/13 2227 12/31/13 0343 01/25/14 1330  WBC 10.4 8.5 9.9  HGB 14.4 13.5 12.6*  HCT 41.8 40.1 38.2*  PLT 269 250 251    COAGS:  Recent Labs  12/30/13 2227 01/25/14 1330  INR 1.03 1.11  APTT 32 32    BMP:  Recent Labs  12/30/13 2227 12/31/13 0343 01/25/14 1330  NA 140 137 142  K 4.1 4.3 4.0  CL 102 104 106  CO2 20 13* 22  GLUCOSE 106* 78 87  BUN 14 15 13   CALCIUM 9.7 9.2 9.2  CREATININE 1.20 0.98 1.19  GFRNONAA 69* >90 70*  GFRAA 80* >90 81*    LIVER FUNCTION TESTS:  Recent Labs  12/31/13 0343 01/25/14 1330  BILITOT 0.6 0.3  AST 23 23  ALT 12 45  ALKPHOS 60 50  PROT 7.1 7.2  ALBUMIN 4.2 4.0   Assessment and Plan: TIA, left sided numbness and weakness 12/30/13, resolved  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 Patient has been NPO, on plavix and aspirin, labs reviewed Risks and Benefits discussed with the patient. All of the patient's questions were answered, patient is agreeable to proceed. Consent signed and in chart.    SignedHedy Jacob 01/31/2014, 8:08 AM

## 2014-01-31 NOTE — Transfer of Care (Signed)
Immediate Anesthesia Transfer of Care Note  Patient: Bradley Brock  Procedure(s) Performed: Procedure(s): [place] (N/A)  Patient Location: PACU  Anesthesia Type:General  Level of Consciousness: awake, alert  and oriented  Airway & Oxygen Therapy: Patient Spontanous Breathing and Patient connected to nasal cannula oxygen  Post-op Assessment: Report given to PACU RN, Post -op Vital signs reviewed and stable and Patient moving all extremities X 4  Post vital signs: Reviewed and stable  Complications: No apparent anesthesia complications

## 2014-02-01 ENCOUNTER — Encounter (HOSPITAL_COMMUNITY): Payer: Self-pay | Admitting: *Deleted

## 2014-02-01 ENCOUNTER — Other Ambulatory Visit: Payer: Self-pay | Admitting: Radiology

## 2014-02-01 DIAGNOSIS — I671 Cerebral aneurysm, nonruptured: Secondary | ICD-10-CM

## 2014-02-01 LAB — HEPARIN LEVEL (UNFRACTIONATED): Heparin Unfractionated: <0.1 IU/mL — ABNORMAL LOW (ref 0.30–0.70)

## 2014-02-01 LAB — BASIC METABOLIC PANEL
Anion gap: 10 (ref 5–15)
BUN: 15 mg/dL (ref 6–23)
CHLORIDE: 107 meq/L (ref 96–112)
CO2: 22 mEq/L (ref 19–32)
Calcium: 8.6 mg/dL (ref 8.4–10.5)
Creatinine, Ser: 1.1 mg/dL (ref 0.50–1.35)
GFR calc Af Amer: 89 mL/min — ABNORMAL LOW (ref >90)
GFR calc non Af Amer: 77 mL/min — ABNORMAL LOW (ref >90)
GLUCOSE: 104 mg/dL — AB (ref 70–99)
Potassium: 4 mEq/L (ref 3.7–5.3)
Sodium: 139 mEq/L (ref 137–147)

## 2014-02-01 LAB — CBC WITH DIFFERENTIAL/PLATELET
Basophils Absolute: 0 10*3/uL (ref 0.0–0.1)
Basophils Relative: 1 % (ref 0–1)
EOS ABS: 0.1 10*3/uL (ref 0.0–0.7)
Eosinophils Relative: 2 % (ref 0–5)
HEMATOCRIT: 33.9 % — AB (ref 39.0–52.0)
HEMOGLOBIN: 11.4 g/dL — AB (ref 13.0–17.0)
Lymphocytes Relative: 30 % (ref 12–46)
Lymphs Abs: 2.3 10*3/uL (ref 0.7–4.0)
MCH: 24.3 pg — AB (ref 26.0–34.0)
MCHC: 33.6 g/dL (ref 30.0–36.0)
MCV: 72.1 fL — ABNORMAL LOW (ref 78.0–100.0)
MONO ABS: 0.6 10*3/uL (ref 0.1–1.0)
MONOS PCT: 8 % (ref 3–12)
NEUTROS ABS: 4.5 10*3/uL (ref 1.7–7.7)
Neutrophils Relative %: 59 % (ref 43–77)
Platelets: 237 10*3/uL (ref 150–400)
RBC: 4.7 MIL/uL (ref 4.22–5.81)
RDW: 15.1 % (ref 11.5–15.5)
WBC: 7.6 10*3/uL (ref 4.0–10.5)

## 2014-02-01 MED ORDER — ALPRAZOLAM 0.5 MG PO TABS
0.5000 mg | ORAL_TABLET | Freq: Two times a day (BID) | ORAL | Status: DC | PRN
  Administered 2014-02-01: 0.5 mg via ORAL
  Filled 2014-02-01: qty 1

## 2014-02-01 NOTE — Discharge Summary (Signed)
Patient ID: Bradley Brock MRN: [id number] DOB/AGE: [birth year] 47 y.o.  Admit date: 01/31/2014 Discharge date: 02/01/2014  Admission Diagnoses: Left MCA bifurcation aneurysm.  Discharge Diagnoses:  Active Problems:   Brain aneurysm S/p irregular bilobed Lt MCA bifurcation aneurysm embolization with complete obliteration using coils with LVIS JR stent assistance.  Discharged Condition: Good, stable.  Hospital Course: Bradley Brock is a 48 y.o. male who presented on 12/30/13 with left sided weakness and numbness and MRI/MRA performed revealed 4 mm bilobed aneurysm at the left MCA bifurcation. The patient's symptoms resolved and he was seen as an outpatient in consultation with Dr. Estanislado Pandy on 01/03/14 and scheduled 01/31/14 for a cerebral diagnostic arteriogram with possible embolization.   He presented to short stay as an outpatient on 01/31/14 and underwent successful cerebral arteriogram with embolization of irregular bilobed Lt MCA bifurcation aneurysm with complete obliteration using coils with LVIS JR stent assistance. He was extubated with no complications and admitted overnight in Neuro ICU for close observation and serial vitals and labs. The patient's labs and vitals are stable. He did well overnight, however did not get much sleep. He does c/o intermittent left orbital pulsatile headache and neck pain- Dr. Estanislado Pandy is aware. He denies any vision changes, speech changes or extremity weakness. He was able to eat a full breakfast and states he has good appetite and denies any N/V. He had his foley catheter removed and has voided since with clear yellow urine. He denies any chest pain, shortness of breath or right groin access site pain, swelling or bleeding. He does admit to some lightheadedness and dizziness and thinks this is related to xanax withdrawal as he has experienced this before and he has not had his xanax medication today, last dose was yesterday prior to procedure. Dr. Estanislado Pandy  has seen and examined the patient and feels he is stable for discharge home with his family. He was given the below instructions and also encouraged to drink lots of water. Our office will call the patient for a follow-up appointment for 2 weeks. The patient states understanding of the above plan and also states he has enough plavix at home for 2 weeks. Upon discharge the patient denies any further lightheadedness or dizziness and has ambulated without difficulty.   Consults: Anesthesia  Significant Diagnostic Studies: Cerebral arteriogram  Treatments: S/p irregular bilobed Lt MCA bifurcation aneurysm embolization with complete obliteration using coils with LVIS JR stent assistance.  Discharge Exam: Blood pressure 120/70, pulse 78, temperature 98.7 F (37.1 C), temperature source Oral, resp. rate 15, height 5\' 9"  (1.753 m), weight 167 lb 15.9 oz (76.2 kg), SpO2 99.00%.  General: A&Ox3, NAD, sitting up in bed Heart: RRR without M/G/R Lungs: CTA bilaterally without W/R/R Abd: Soft, NT, ND, (+) BS Ext: Right groin site dressing C/D/I, no signs of bleeding, infection or hematoma, DP 2+ bilaterally, warm. Neuro: Grossly intact, smile symmetrical, speech clear, tongue midline, able to whistle, equal strength upper and lower extremities 5/5, no ataxia, fine motor intact, EOMI, PERRLA  Disposition: 01-Home or Self Care  Discharge Instructions   Call MD for:  difficulty breathing, headache or visual disturbances    Complete by:  As directed      Call MD for:  extreme fatigue    Complete by:  As directed      Call MD for:  hives    Complete by:  As directed      Call MD for:  persistant dizziness or light-headedness  Complete by:  As directed      Call MD for:  persistant nausea and vomiting    Complete by:  As directed      Call MD for:  redness, tenderness, or signs of infection (pain, swelling, redness, odor or green/yellow discharge around incision site)    Complete by:  As directed        Call MD for:  severe uncontrolled pain    Complete by:  As directed      Call MD for:  temperature >100.4    Complete by:  As directed      Diet - low sodium heart healthy    Complete by:  As directed      Driving Restrictions    Complete by:  As directed   No driving x 2 weeks     Increase activity slowly    Complete by:  As directed      Lifting restrictions    Complete by:  As directed   No lifting >15 lbs x 2 weeks     Remove dressing in 24 hours    Complete by:  As directed             Medication List         ALPRAZolam 0.25 MG tablet  Commonly known as:  XANAX  Take 0.25 mg by mouth 2 (two) times daily.     aspirin 325 MG tablet  Take 1 tablet (325 mg total) by mouth daily.     clopidogrel 75 MG tablet  Commonly known as:  PLAVIX  Take 75 mg by mouth daily.     fluticasone 50 MCG/ACT nasal spray  Commonly known as:  FLONASE  Place 1 spray into both nostrils daily.     megestrol 40 MG/ML suspension  Commonly known as:  MEGACE  Take 200 mg by mouth daily.     omeprazole 20 MG capsule  Commonly known as:  PRILOSEC  Take 20 mg by mouth daily.     oxyCODONE-acetaminophen 10-325 MG per tablet  Commonly known as:  PERCOCET  Take 1 tablet by mouth every 6 (six) hours as needed for pain.     simvastatin 20 MG tablet  Commonly known as:  ZOCOR  Take 20 mg by mouth at bedtime.     sodium chloride 0.65 % Soln nasal spray  Commonly known as:  OCEAN  Place 1 spray into both nostrils daily.           Follow-up Information   Follow up with Rob Hickman, MD In 2 weeks. (Our office will call with appointment [phone])    Specialty:  Interventional Radiology   Contact information:   [address] [place] [address] [phone]       Signed: Tsosie Billing PA-C Interventional Radiology  02/01/2014, 1:14 PM

## 2014-02-01 NOTE — Progress Notes (Signed)
Patient sitting on side of bed c/o light headiness and feeling sick . As he sit got a little better.

## 2014-02-01 NOTE — Progress Notes (Signed)
Late note patient was discharged to home with wife. After Xanax 0.5 mg po was given he states that there was not more dizziness or light headiness or burning Sensations  He related all of this to not getting his xanax which he says he takes BID. He was ready for discharge went over all orders and handout was given to wife

## 2014-02-02 ENCOUNTER — Encounter (HOSPITAL_COMMUNITY): Payer: Self-pay | Admitting: Interventional Radiology

## 2014-02-04 ENCOUNTER — Emergency Department (HOSPITAL_BASED_OUTPATIENT_CLINIC_OR_DEPARTMENT_OTHER)
Admission: EM | Admit: 2014-02-04 | Discharge: 2014-02-04 | Disposition: A | Discharge status: Home / Self Care | Payer: Medicaid Other | Attending: Emergency Medicine | Admitting: Emergency Medicine

## 2014-02-04 ENCOUNTER — Emergency Department (HOSPITAL_BASED_OUTPATIENT_CLINIC_OR_DEPARTMENT_OTHER): Payer: Medicaid Other

## 2014-02-04 ENCOUNTER — Encounter (HOSPITAL_BASED_OUTPATIENT_CLINIC_OR_DEPARTMENT_OTHER): Payer: Self-pay | Admitting: Emergency Medicine

## 2014-02-04 DIAGNOSIS — Z87891 Personal history of nicotine dependence: Secondary | ICD-10-CM | POA: Insufficient documentation

## 2014-02-04 DIAGNOSIS — Z7951 Long term (current) use of inhaled steroids: Secondary | ICD-10-CM | POA: Insufficient documentation

## 2014-02-04 DIAGNOSIS — Z7982 Long term (current) use of aspirin: Secondary | ICD-10-CM | POA: Diagnosis not present

## 2014-02-04 DIAGNOSIS — Z8673 Personal history of transient ischemic attack (TIA), and cerebral infarction without residual deficits: Secondary | ICD-10-CM | POA: Diagnosis not present

## 2014-02-04 DIAGNOSIS — F419 Anxiety disorder, unspecified: Secondary | ICD-10-CM | POA: Diagnosis not present

## 2014-02-04 DIAGNOSIS — Z8501 Personal history of malignant neoplasm of esophagus: Secondary | ICD-10-CM | POA: Insufficient documentation

## 2014-02-04 DIAGNOSIS — Z79899 Other long term (current) drug therapy: Secondary | ICD-10-CM | POA: Diagnosis not present

## 2014-02-04 DIAGNOSIS — Z7902 Long term (current) use of antithrombotics/antiplatelets: Secondary | ICD-10-CM | POA: Insufficient documentation

## 2014-02-04 DIAGNOSIS — E785 Hyperlipidemia, unspecified: Secondary | ICD-10-CM | POA: Insufficient documentation

## 2014-02-04 DIAGNOSIS — S20212A Contusion of left front wall of thorax, initial encounter: Secondary | ICD-10-CM | POA: Diagnosis not present

## 2014-02-04 DIAGNOSIS — M5020 Other cervical disc displacement, unspecified cervical region: Secondary | ICD-10-CM | POA: Insufficient documentation

## 2014-02-04 DIAGNOSIS — K219 Gastro-esophageal reflux disease without esophagitis: Secondary | ICD-10-CM | POA: Diagnosis not present

## 2014-02-04 DIAGNOSIS — Y9389 Activity, other specified: Secondary | ICD-10-CM | POA: Diagnosis not present

## 2014-02-04 DIAGNOSIS — Z923 Personal history of irradiation: Secondary | ICD-10-CM | POA: Insufficient documentation

## 2014-02-04 DIAGNOSIS — W01198A Fall on same level from slipping, tripping and stumbling with subsequent striking against other object, initial encounter: Secondary | ICD-10-CM | POA: Diagnosis not present

## 2014-02-04 DIAGNOSIS — Z8679 Personal history of other diseases of the circulatory system: Secondary | ICD-10-CM | POA: Insufficient documentation

## 2014-02-04 DIAGNOSIS — Y9222 Religious institution as the place of occurrence of the external cause: Secondary | ICD-10-CM | POA: Diagnosis not present

## 2014-02-04 DIAGNOSIS — W19XXXA Unspecified fall, initial encounter: Secondary | ICD-10-CM

## 2014-02-04 DIAGNOSIS — S29001A Unspecified injury of muscle and tendon of front wall of thorax, initial encounter: Secondary | ICD-10-CM | POA: Diagnosis present

## 2014-02-04 HISTORY — DX: Cerebral aneurysm, nonruptured: I67.1

## 2014-02-04 MED ORDER — OXYCODONE-ACETAMINOPHEN 5-325 MG PO TABS
2.0000 | ORAL_TABLET | Freq: Once | ORAL | Status: AC
  Administered 2014-02-04: 2 via ORAL
  Filled 2014-02-04: qty 2

## 2014-02-04 NOTE — Discharge Instructions (Signed)
Continue your pain medication as needed for pain.  Return to the emergency department if he develops severe pain, difficulty breathing, or other new and concerning symptoms.   Chest Contusion A chest contusion is a deep bruise on your chest area. Contusions are the result of an injury that caused bleeding under the skin. A chest contusion may involve bruising of the skin, muscles, or ribs. The contusion may turn blue, purple, or yellow. Minor injuries will give you a painless contusion, but more severe contusions may stay painful and swollen for a few weeks. CAUSES  A contusion is usually caused by a blow, trauma, or direct force to an area of the body. SYMPTOMS   Swelling and redness of the injured area.  Discoloration of the injured area.  Tenderness and soreness of the injured area.  Pain. DIAGNOSIS  The diagnosis can be made by taking a history and performing a physical exam. An X-ray, CT scan, or MRI may be needed to determine if there were any associated injuries, such as broken bones (fractures) or internal injuries. TREATMENT  Often, the best treatment for a chest contusion is resting, icing, and applying cold compresses to the injured area. Deep breathing exercises may be recommended to reduce the risk of pneumonia. Over-the-counter medicines may also be recommended for pain control. HOME CARE INSTRUCTIONS   Put ice on the injured area.  Put ice in a plastic bag.  Place a towel between your skin and the bag.  Leave the ice on for 15-20 minutes, 03-04 times a day.  Only take over-the-counter or prescription medicines as directed by your caregiver. Your caregiver may recommend avoiding anti-inflammatory medicines (aspirin, ibuprofen, and naproxen) for 48 hours because these medicines may increase bruising.  Rest the injured area.  Perform deep-breathing exercises as directed by your caregiver.  Stop smoking if you smoke.  Do not lift objects over 5 pounds (2.3 kg) for 3  days or longer if recommended by your caregiver. SEEK IMMEDIATE MEDICAL CARE IF:   You have increased bruising or swelling.  You have pain that is getting worse.  You have difficulty breathing.  You have dizziness, weakness, or fainting.  You have blood in your urine or stool.  You cough up or vomit blood.  Your swelling or pain is not relieved with medicines. MAKE SURE YOU:   Understand these instructions.  Will watch your condition.  Will get help right away if you are not doing well or get worse. Document Released: 12/23/2000 Document Revised: 12/23/2011 Document Reviewed: 09/21/2011 [facility] Patient Information 2015 [place], [place]. This information is not intended to replace advice given to you by your health care provider. Make sure you discuss any questions you have with your health care provider.

## 2014-02-04 NOTE — ED Provider Notes (Signed)
CSN: [id number]     Arrival date & time 02/04/14  1545 History  This chart was scribed for Veryl Speak, MD by Peyton Bottoms, ED Scribe. This patient was seen in room MH10/MH10 and the patient's care was started at 4:01 PM.   Chief Complaint  Patient presents with  . Chest Pain   Patient is a 48 y.o. male presenting with chest pain. The history is provided by the patient. No language interpreter was used.  Chest Pain Pain location:  L chest Pain quality: aching, sharp and shooting   Pain radiates to:  Does not radiate Pain radiates to the back: no   Pain severity:  Moderate Onset quality:  Sudden Timing:  Constant Progression:  Unchanged Chronicity:  New Context: breathing and movement   Relieved by:  Nothing Worsened by:  Nothing tried Ineffective treatments:  None tried  HPI Comments: Bradley Brock is a 48 y.o. male with a history of an aneurysm, TIA, who presents to the Emergency Department complaining of pain in left side of chest due to a fall that occurred earlier today. He states that he tripped while at church and accidentally grabbed onto a flower vase instead of the wall. The flower vase landed on his chest. Patient also has associated SOB and pain with breathing. He states that his pain worsens with movement. Patient states he had recent aneurysm surgery 4 days ago. He denies LOC during fall. Patient is unsure of head impact, but denies headache.  Past Medical History  Diagnosis Date  . DDD (degenerative disc disease), cervical   . Acid reflux   . Anxiety   . TIA (transient ischemic attack) 12/30/2013  . 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 Hyperlipemia   . Headache   . Aneurysm of anterior cerebral artery    Past Surgical History  Procedure Laterality Date  . Biopsy pharynx      esophageal  . Radiology with anesthesia N/A 01/31/2014    Procedure: [place];  Surgeon: Rob Hickman, MD;  Location: [facility];   Service: Radiology;  Laterality: N/A;  . Other surgical history      cerebral stent   Family History  Problem Relation Age of Onset  . Cancer Mother   . Stomach cancer Brother   . Testicular cancer Brother    History  Substance Use Topics  . Smoking status: Former Research scientist (life sciences)  . Smokeless tobacco: Former Systems developer    Quit date: 04/13/2013  . Alcohol Use: No   Review of Systems  Cardiovascular: Positive for chest pain.  All other systems reviewed and are negative.  A complete 10 system review of systems was obtained and all systems are negative except as noted in the HPI and PMH.   Allergies  Vicodin and Tape  Home Medications   Prior to Admission medications   Medication Sig Start Date End Date Taking? Authorizing Provider  ALPRAZolam (XANAX) 0.25 MG tablet Take 0.25 mg by mouth 2 (two) times daily.    Historical Provider, MD  aspirin 325 MG tablet Take 1 tablet (325 mg total) by mouth daily. 01/01/14   Velvet Bathe, MD  clopidogrel (PLAVIX) 75 MG tablet Take 75 mg by mouth daily.    Historical Provider, MD  fluticasone (FLONASE) 50 MCG/ACT nasal spray Place 1 spray into both nostrils daily.     Historical Provider, MD  megestrol (MEGACE) 40 MG/ML suspension Take 200 mg by mouth daily.     Historical  Provider, MD  omeprazole (PRILOSEC) 20 MG capsule Take 20 mg by mouth daily.    Historical Provider, MD  oxyCODONE-acetaminophen (PERCOCET) 10-325 MG per tablet Take 1 tablet by mouth every 6 (six) hours as needed for pain.    Historical Provider, MD  simvastatin (ZOCOR) 20 MG tablet Take 20 mg by mouth at bedtime.    Historical Provider, MD  sodium chloride (OCEAN) 0.65 % SOLN nasal spray Place 1 spray into both nostrils daily.     Historical Provider, MD   Triage Vitals: BP 131/82  Pulse 103  Temp(Src) 98 F (36.7 C) (Oral)  Resp 24  SpO2 100%  Physical Exam  Nursing note and vitals reviewed. Constitutional: He is oriented to person, place, and time. He appears well-developed and  well-nourished. No distress.  HENT:  Head: Normocephalic and atraumatic.  Eyes: Conjunctivae and EOM are normal.  Neck: Neck supple. No tracheal deviation present.  Cardiovascular: Normal rate.   Pulmonary/Chest: Effort normal and breath sounds normal. No respiratory distress. He exhibits tenderness.  Tenderness to palpation over the left lower anterior ribs. No crepitus.   Abdominal: Soft. There is no tenderness.  Musculoskeletal: Normal range of motion.  Neurological: He is alert and oriented to person, place, and time.  Skin: Skin is warm and dry.  Psychiatric: He has a normal mood and affect. His behavior is normal.   ED Course  Procedures (including critical care time)  DIAGNOSTIC STUDIES: Oxygen Saturation is 100% on RA, normal by my interpretation.    COORDINATION OF CARE: 4:04 PM- Discussed plans to order diagnostic imaging of left chest and ribs. Pt advised of plan for treatment and pt agrees.  Labs Review Labs Reviewed - No data to display  Imaging Review No results found.   EKG Interpretation None     MDM   Final diagnoses:  None    X-rays reveal no evidence for fracture or pneumothorax. This appears to be a chest wall contusion which will be treated with Percocet. He has a prescription for this as he is in a pain management clinic. He declines a prescription from me stating that he is on contract just to get these pills from them. He is to return for any problems.  I personally performed the services described in this documentation, which was scribed in my presence. The recorded information has been reviewed and is accurate.  Veryl Speak, MD 02/04/14 [phone]

## 2014-02-04 NOTE — ED Notes (Signed)
Pt reports he tripped and fell this morning - left side of chest landed on flower vase- pt c/o pain with breathing and SOB

## 2014-02-14 ENCOUNTER — Ambulatory Visit (HOSPITAL_COMMUNITY)
Admission: RE | Admit: 2014-02-14 | Discharge: 2014-02-14 | Disposition: A | Discharge status: Home / Self Care | Payer: Medicaid Other | Source: Ambulatory Visit | Attending: Radiology | Admitting: Radiology

## 2014-02-14 DIAGNOSIS — Z48812 Encounter for surgical aftercare following surgery on the circulatory system: Secondary | ICD-10-CM | POA: Diagnosis not present

## 2014-02-14 DIAGNOSIS — I671 Cerebral aneurysm, nonruptured: Secondary | ICD-10-CM

## 2014-02-14 LAB — PLATELET INHIBITION P2Y12: Platelet Function  P2Y12: 0 [PRU] — ABNORMAL LOW (ref 194–418)

## 2014-02-23 ENCOUNTER — Telehealth (HOSPITAL_COMMUNITY): Payer: Self-pay | Admitting: Interventional Radiology

## 2014-02-23 ENCOUNTER — Other Ambulatory Visit: Payer: Self-pay | Admitting: Radiology

## 2014-02-23 LAB — PLATELET INHIBITION P2Y12: PLATELET FUNCTION P2Y12: 4 [PRU] — AB (ref 194–418)

## 2014-02-23 NOTE — Telephone Encounter (Signed)
Called pt asked him to come by and have a P2Y12 drawn today. He states he will be here in about an hour. JM

## 2014-03-06 ENCOUNTER — Telehealth (HOSPITAL_COMMUNITY): Payer: Self-pay | Admitting: Interventional Radiology

## 2014-03-06 NOTE — Telephone Encounter (Signed)
Called pt and told him to start Plavix 75mg  1/2 tablet 1 qd and continue as on his Aspirin 325mg  1 qd. He states understanding and is in agreement with this plan of care. JM

## 2014-03-11 ENCOUNTER — Emergency Department (HOSPITAL_BASED_OUTPATIENT_CLINIC_OR_DEPARTMENT_OTHER): Payer: Medicaid Other

## 2014-03-11 ENCOUNTER — Encounter (HOSPITAL_BASED_OUTPATIENT_CLINIC_OR_DEPARTMENT_OTHER): Payer: Self-pay | Admitting: Emergency Medicine

## 2014-03-11 ENCOUNTER — Emergency Department (HOSPITAL_BASED_OUTPATIENT_CLINIC_OR_DEPARTMENT_OTHER)
Admission: EM | Admit: 2014-03-11 | Discharge: 2014-03-11 | Disposition: A | Discharge status: Home / Self Care | Payer: Medicaid Other | Attending: Emergency Medicine | Admitting: Emergency Medicine

## 2014-03-11 DIAGNOSIS — R51 Headache: Secondary | ICD-10-CM | POA: Diagnosis not present

## 2014-03-11 DIAGNOSIS — R519 Headache, unspecified: Secondary | ICD-10-CM

## 2014-03-11 DIAGNOSIS — R110 Nausea: Secondary | ICD-10-CM | POA: Diagnosis not present

## 2014-03-11 DIAGNOSIS — Z8501 Personal history of malignant neoplasm of esophagus: Secondary | ICD-10-CM | POA: Insufficient documentation

## 2014-03-11 DIAGNOSIS — F419 Anxiety disorder, unspecified: Secondary | ICD-10-CM | POA: Diagnosis not present

## 2014-03-11 DIAGNOSIS — Z7951 Long term (current) use of inhaled steroids: Secondary | ICD-10-CM | POA: Insufficient documentation

## 2014-03-11 DIAGNOSIS — Z8673 Personal history of transient ischemic attack (TIA), and cerebral infarction without residual deficits: Secondary | ICD-10-CM | POA: Diagnosis not present

## 2014-03-11 DIAGNOSIS — E785 Hyperlipidemia, unspecified: Secondary | ICD-10-CM | POA: Diagnosis not present

## 2014-03-11 DIAGNOSIS — Z8739 Personal history of other diseases of the musculoskeletal system and connective tissue: Secondary | ICD-10-CM | POA: Diagnosis not present

## 2014-03-11 DIAGNOSIS — Z79899 Other long term (current) drug therapy: Secondary | ICD-10-CM | POA: Diagnosis not present

## 2014-03-11 DIAGNOSIS — Z7902 Long term (current) use of antithrombotics/antiplatelets: Secondary | ICD-10-CM | POA: Diagnosis not present

## 2014-03-11 DIAGNOSIS — Z87891 Personal history of nicotine dependence: Secondary | ICD-10-CM | POA: Diagnosis not present

## 2014-03-11 DIAGNOSIS — G8929 Other chronic pain: Secondary | ICD-10-CM | POA: Diagnosis not present

## 2014-03-11 DIAGNOSIS — K219 Gastro-esophageal reflux disease without esophagitis: Secondary | ICD-10-CM | POA: Diagnosis not present

## 2014-03-11 DIAGNOSIS — Z8679 Personal history of other diseases of the circulatory system: Secondary | ICD-10-CM | POA: Insufficient documentation

## 2014-03-11 DIAGNOSIS — Z7982 Long term (current) use of aspirin: Secondary | ICD-10-CM | POA: Diagnosis not present

## 2014-03-11 LAB — CBC WITH DIFFERENTIAL/PLATELET
BASOS ABS: 0 10*3/uL (ref 0.0–0.1)
Basophils Relative: 0 % (ref 0–1)
Eosinophils Absolute: 0.2 10*3/uL (ref 0.0–0.7)
Eosinophils Relative: 3 % (ref 0–5)
HCT: 38.5 % — ABNORMAL LOW (ref 39.0–52.0)
Hemoglobin: 12.8 g/dL — ABNORMAL LOW (ref 13.0–17.0)
LYMPHS ABS: 2 10*3/uL (ref 0.7–4.0)
Lymphocytes Relative: 24 % (ref 12–46)
MCH: 24 pg — ABNORMAL LOW (ref 26.0–34.0)
MCHC: 33.2 g/dL (ref 30.0–36.0)
MCV: 72.1 fL — ABNORMAL LOW (ref 78.0–100.0)
Monocytes Absolute: 0.8 10*3/uL (ref 0.1–1.0)
Monocytes Relative: 10 % (ref 3–12)
NEUTROS ABS: 5.3 10*3/uL (ref 1.7–7.7)
NEUTROS PCT: 63 % (ref 43–77)
Platelets: 256 10*3/uL (ref 150–400)
RBC: 5.34 MIL/uL (ref 4.22–5.81)
RDW: 15.4 % (ref 11.5–15.5)
WBC: 8.3 10*3/uL (ref 4.0–10.5)

## 2014-03-11 LAB — BASIC METABOLIC PANEL
ANION GAP: 13 (ref 5–15)
BUN: 11 mg/dL (ref 6–23)
CO2: 22 mEq/L (ref 19–32)
Calcium: 9.5 mg/dL (ref 8.4–10.5)
Chloride: 106 mEq/L (ref 96–112)
Creatinine, Ser: 1.1 mg/dL (ref 0.50–1.35)
GFR calc Af Amer: 89 mL/min — ABNORMAL LOW (ref >90)
GFR calc non Af Amer: 77 mL/min — ABNORMAL LOW (ref >90)
GLUCOSE: 112 mg/dL — AB (ref 70–99)
POTASSIUM: 4.2 meq/L (ref 3.7–5.3)
SODIUM: 141 meq/L (ref 137–147)

## 2014-03-11 MED ORDER — SODIUM CHLORIDE 0.9 % IV BOLUS (SEPSIS)
1000.0000 mL | Freq: Once | INTRAVENOUS | Status: AC
  Administered 2014-03-11: 1000 mL via INTRAVENOUS

## 2014-03-11 MED ORDER — KETOROLAC TROMETHAMINE 30 MG/ML IJ SOLN
30.0000 mg | Freq: Once | INTRAMUSCULAR | Status: AC
  Administered 2014-03-11: 30 mg via INTRAVENOUS
  Filled 2014-03-11: qty 1

## 2014-03-11 MED ORDER — IOHEXOL 350 MG/ML SOLN
100.0000 mL | Freq: Once | INTRAVENOUS | Status: AC | PRN
  Administered 2014-03-11: 100 mL via INTRAVENOUS

## 2014-03-11 MED ORDER — DIPHENHYDRAMINE HCL 50 MG/ML IJ SOLN
25.0000 mg | Freq: Once | INTRAMUSCULAR | Status: AC
  Administered 2014-03-11: 25 mg via INTRAVENOUS

## 2014-03-11 MED ORDER — METOCLOPRAMIDE HCL 5 MG/ML IJ SOLN
INTRAMUSCULAR | Status: AC
  Administered 2014-03-11: 10 mg via INTRAVENOUS
  Filled 2014-03-11: qty 2

## 2014-03-11 MED ORDER — METOCLOPRAMIDE HCL 5 MG/ML IJ SOLN
10.0000 mg | Freq: Once | INTRAMUSCULAR | Status: AC
  Administered 2014-03-11: 10 mg via INTRAVENOUS

## 2014-03-11 MED ORDER — DIPHENHYDRAMINE HCL 50 MG/ML IJ SOLN
INTRAMUSCULAR | Status: AC
Start: 2014-03-11 — End: 2014-03-11
  Filled 2014-03-11: qty 1

## 2014-03-11 NOTE — Discharge Instructions (Signed)
General Headache Without Cause A headache is pain or discomfort felt around the head or neck area. The specific cause of a headache may not be found. There are many causes and types of headaches. A few common ones are:  Tension headaches.  Migraine headaches.  Cluster headaches.  Chronic daily headaches. HOME CARE INSTRUCTIONS   Keep all follow-up appointments with your caregiver or any specialist referral.  Only take over-the-counter or prescription medicines for pain or discomfort as directed by your caregiver.  Lie down in a dark, quiet room when you have a headache.  Keep a headache journal to find out what may trigger your migraine headaches. For example, write down:  What you eat and drink.  How much sleep you get.  Any change to your diet or medicines.  Try massage or other relaxation techniques.  Put ice packs or heat on the head and neck. Use these 3 to 4 times per day for 15 to 20 minutes each time, or as needed.  Limit stress.  Sit up straight, and do not tense your muscles.  Quit smoking if you smoke.  Limit alcohol use.  Decrease the amount of caffeine you drink, or stop drinking caffeine.  Eat and sleep on a regular schedule.  Get 7 to 9 hours of sleep, or as recommended by your caregiver.  Keep lights dim if bright lights bother you and make your headaches worse. SEEK MEDICAL CARE IF:   You have problems with the medicines you were prescribed.  Your medicines are not working.  You have a change from the usual headache.  You have nausea or vomiting. SEEK IMMEDIATE MEDICAL CARE IF:   Your headache becomes severe.  You have a fever.  You have a stiff neck.  You have loss of vision.  You have muscular weakness or loss of muscle control.  You start losing your balance or have trouble walking.  You feel faint or pass out.  You have severe symptoms that are different from your first symptoms. MAKE SURE YOU:   Understand these  instructions.  Will watch your condition.  Will get help right away if you are not doing well or get worse. Document Released: 03/30/2005 Document Revised: 06/22/2011 Document Reviewed: 04/15/2011 [facility] Patient Information 2015 [place], [place]. This information is not intended to replace advice given to you by your health care provider. Make sure you discuss any questions you have with your health care provider.   Cerebral Aneurysm An aneurysm is the bulging or ballooning out of part of the weakened wall of a vein or artery. An aneurysm in the vein or artery of the brain is called a brain aneurysm, or cerebral aneurysm.  Aneurysms are a risk to your health because they may leak or rupture. Once the aneurysm leaks or ruptures, bleeding occurs. If the bleeding occurs within the brain tissue, the condition is called an intracerebral hemorrhage. An intracerebral hemorrhage can result in a hemorrhagic stroke. If the bleeding occurs in the area between the brain and the thin tissues that cover the brain, the condition is called a subarachnoid hemorrhage. This increases the pressure on the brain and causes some areas of the brain to not get the necessary blood flow. The blood from the ruptured aneurysm collects and presses on the surrounding brain tissue. A subarachnoid hemorrhage can cause a stroke. A ruptured cerebral aneurysm is a medical emergency. This can cause permanent damage and loss of brain function. CAUSES A cerebral aneurysm is caused when a weakened  part of the blood vessel expands. The blood vessel expands due to the constant pressure from the flow of blood through the weakened blood vessel. Usually the aneurysm expands slowly. As the weakened aneurysm expands, the walls of the aneurysm become weaker. Aneurysms may be associated with diseases that weaken and damage the walls of your blood vessels or blood vessels that develop abnormally. Some known causes for cerebral aneurysms are:  Head  trauma.  Infection.  Use of "recreational drugs" such as cocaine or amphetamines. RISK FACTORS People at risk for a cerebral aneurysm or hemorrhagic stroke usually have one or more risk factors, which include:  Having high blood pressure (hypertension).  Abusing alcohol.  Having abnormal blood vessels present since birth.  Having certain bleeding disorders, such as hemophilia, sickle cell disease, or liver disease.  Taking blood thinners (anticoagulants).  Smoking. SIGNS AND SYMPTOMS  The signs and symptoms of an unruptured cerebral aneurysm will partly depend on its size and rate of growth. A small, unchanging aneurysm generally does not produce symptoms. A larger aneurysm that is steadily growing can increase pressure on the brain or nerves. That increased pressure from the unruptured cerebral aneurysm can cause:  A headache.  Problems with your vision.  Numbness or weakness in an arm or leg.  Problems with memory.  Problems speaking.  Seizures. If an aneurysm leaks or bursts, it can cause a stroke and be life-threatening. Symptoms may include:  A sudden, severe headache with no known cause. The headache is often described as the worst headache ever experienced.  Nausea or vomiting, especially when combined with other symptoms such as a headache.  Sudden weakness or numbness of the face, arm, or leg, especially on one side of the body.  Sudden trouble walking or difficulty moving arms or legs.  Sudden confusion.  Sudden personality changes.  Trouble speaking (aphasia) or understanding.  Difficulty swallowing.  Sudden trouble seeing in one or both eyes.  Double vision.  Dizziness.  Loss of balance or coordination.  Intolerance to light.  Stiff neck. DIAGNOSIS  A CTA (computed tomographic angiography) may be performed to diagnose an aneurysm. A CTA uses dye and a CT scanner to take images of your blood vessels. An MRA (magnetic resonance angiography) may  be used to diagnose an aneurysm. An MRA is performed in an MRI machine. While in the MRI machine, images of your blood vessels are taken. A cerebral aneurysm may also be diagnosed with a cerebral angiogram. A cerebral angiogram requires a tube called a catheter to be inserted into a blood vessel and advanced to the blood vessels in your neck. Dye is then injected while X-ray images are taken to show the blood vessels in your brain. TREATMENT  Unruptured Aneurysms Treatment is complex when an aneurysm is found and it is not causing problems. Treatment is very individualized, as each case is different. Many things must be considered, such as the size and exact location of your aneurysm, your age, your overall health, and your feelings and preferences. Small aneurysms in certain locations of the brain have a very low chance of bleeding or rupturing. These small aneurysms may not be treated. However, depending on the size and location of the aneurysm, treatments may be recommended and include:  Coiling. During this procedure, a catheter is inserted and advanced through a blood vessel. Once the catheter reaches the aneurysm, tiny coils are used to block blood flow into the aneurysm.  Surgical clipping. During surgery, a clip is placed at  the base of the aneurysm. The clip prevents blood from continuing to enter the aneurysm. Ruptured Aneurysms Immediate emergency surgery may be needed to help prevent damage to the brain and to reduce the risk of rebleeding. Timing of treatment is an important factor in the prevention of complications. Successful early treatment of a ruptured aneurysm (within the first 3 days of a bleed) helps to prevent rebleeding and blood vessel spasm. In some cases, there may be a reason to treat later (10-14 days after a rupture). Many things are considered when making this decision, and each case is handled individually. HOME CARE INSTRUCTIONS  Take medicines only as instructed by your  health care provider.  Eat healthy foods. It is recommended that you eat 5 or more servings of fruits and vegetables each day. Foods may need to be a special consistency (soft or pureed), or small bites may need to be taken if you have had a ruptured aneurysm or stroke. Certain dietary changes may be advised to address high blood pressure, high cholesterol, diabetes, or obesity.  Food choices that are low in salt (sodium), saturated fat, trans fat, and cholesterol are recommended to manage high blood pressure.  Food choices that are high in fiber and low in saturated fat, trans fat, and cholesterol are recommended to control cholesterol levels.  Controlling carbohydrate and sugar intake is recommended to manage diabetes.  Reducing calorie intake and making food choices that are low in sodium, saturated fat, trans fat, and cholesterol are recommended to manage obesity.  Maintain a healthy weight.  Stay physically active. It is recommended that you get at least 30 minutes of activity on most or all days.  Do not smoke.  Limit alcohol use. Moderate alcohol use is considered to be:  No more than 2 drinks each day for men.  No more than 1 drink each day for nonpregnant women.  Stop drug abuse.  A safe home environment is important to reduce the risk of falls. Your health care provider may arrange for specialists to evaluate your home. Having grab bars in the bedroom and bathroom is often important. Your health care provider may arrange for special equipment to be used at home, such as raised toilets and a seat for the shower.  Physical, occupational, and speech therapy. Ongoing therapy may be needed to maximize your recovery after a ruptured aneurysm or stroke. If you have been advised to use a walker or a cane, use it at all times. Be sure to keep your therapy appointments.  Follow all instructions for follow-up with your health care provider. This is very important. This includes any  referrals, physical therapy, rehabilitation, and laboratory tests. Proper follow-up may prevent an aneurysm rupture or a stroke. SEEK IMMEDIATE MEDICAL CARE IF:  You have a sudden, severe headache with no known cause.  You have sudden nausea or vomiting with a severe headache.  You have sudden weakness or numbness of the face, arm, or leg, especially on one side of the body.  You have sudden trouble walking or difficulty moving arms or legs.  You have sudden confusion.  You have trouble speaking or understanding.  You have sudden trouble seeing in one or both eyes.  You have a sudden loss of balance or coordination.  You have a stiff neck.  You have difficulty breathing.  You have a partial or total loss of consciousness. Any of these symptoms may represent a serious problem that is an emergency. Do not wait to see if  the symptoms will go away. Get medical help at once. Call your local emergency services (911 in [place] Do not drive yourself to the hospital. Document Released: 12/20/2001 Document Revised: 08/14/2013 Document Reviewed: 09/15/2012 [facility] Patient Information 2015 [place], [place]. This information is not intended to replace advice given to you by your health care provider. Make sure you discuss any questions you have with your health care provider.

## 2014-03-11 NOTE — ED Provider Notes (Signed)
CSN: [id number]     Arrival date & time 03/11/14  1140 History   First MD Initiated Contact with Patient 03/11/14 1154     Chief Complaint  Patient presents with  . Headache     (Consider location/radiation/quality/duration/timing/severity/associated sxs/prior Treatment) HPI  48 year old male with a recent history of cerebral aneurysmal coiling and stenting presents with intermittent headache over the past 3 days. The headache is occasionally bilateral and sometimes switches from left to right. He does have periods where the headache goes completely away. This morning at around 4 AM the headache seemed to be worse so he came into the hospital. He's been taking Aleve with no relief. He tried Percocet that he has for his chronic neck pain without relief. Has not noticed any neck stiffness or fevers. No weakness or numbness. He is on Plavix and aspirin for the stent was told to decrease his Plavix one week ago due to his "blood levels being too high". Tried to call his neurosurgeon but has not received a response yet. Currently rates the pain as a 9 out of 10. The pain is mostly behind his eyes, now is behind the right eye. Patient feels like the pain improves when pushing on his eyes.  Past Medical History  Diagnosis Date  . DDD (degenerative disc disease), cervical   . Acid reflux   . Anxiety   . TIA (transient ischemic attack) 12/30/2013  . 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 Hyperlipemia   . Headache   . Aneurysm of anterior cerebral artery    Past Surgical History  Procedure Laterality Date  . Biopsy pharynx      esophageal  . Radiology with anesthesia N/A 01/31/2014    Procedure: [place];  Surgeon: Rob Hickman, MD;  Location: [facility];  Service: Radiology;  Laterality: N/A;  . Other surgical history      cerebral stent   Family History  Problem Relation Age of Onset  . Cancer Mother   . Stomach cancer Brother   .  Testicular cancer Brother    History  Substance Use Topics  . Smoking status: Former Research scientist (life sciences)  . Smokeless tobacco: Former Systems developer    Quit date: 04/13/2013  . Alcohol Use: No    Review of Systems  Constitutional: Negative for fever.  Gastrointestinal: Positive for nausea. Negative for vomiting.  Musculoskeletal: Negative for neck stiffness.  Neurological: Positive for headaches. Negative for weakness and numbness.  All other systems reviewed and are negative.     Allergies  Vicodin and Tape  Home Medications   Prior to Admission medications   Medication Sig Start Date End Date Taking? Authorizing Provider  ALPRAZolam (XANAX) 0.25 MG tablet Take 0.25 mg by mouth 2 (two) times daily.    Historical Provider, MD  aspirin 325 MG tablet Take 1 tablet (325 mg total) by mouth daily. 01/01/14   Velvet Bathe, MD  clopidogrel (PLAVIX) 75 MG tablet Take 75 mg by mouth daily.    Historical Provider, MD  fluticasone (FLONASE) 50 MCG/ACT nasal spray Place 1 spray into both nostrils daily.     Historical Provider, MD  megestrol (MEGACE) 40 MG/ML suspension Take 200 mg by mouth daily.     Historical Provider, MD  omeprazole (PRILOSEC) 20 MG capsule Take 20 mg by mouth daily.    Historical Provider, MD  oxyCODONE-acetaminophen (PERCOCET) 10-325 MG per tablet Take 1 tablet by mouth every 6 (six) hours as needed for  pain.    Historical Provider, MD  simvastatin (ZOCOR) 20 MG tablet Take 20 mg by mouth at bedtime.    Historical Provider, MD  sodium chloride (OCEAN) 0.65 % SOLN nasal spray Place 1 spray into both nostrils daily.     Historical Provider, MD   BP 137/87 mmHg  Pulse 81  Temp(Src) 98.3 F (36.8 C) (Oral)  Resp 18  Ht 5\' 9"  (1.753 m)  Wt 183 lb 4 oz (83.122 kg)  BMI 27.05 kg/m2  SpO2 100% Physical Exam  Constitutional: He is oriented to person, place, and time. He appears well-developed and well-nourished.  HENT:  Head: Normocephalic and atraumatic.  Right Ear: External ear normal.   Left Ear: External ear normal.  Nose: Nose normal.  Eyes: EOM are normal. Pupils are equal, round, and reactive to light. Right eye exhibits no discharge. Left eye exhibits no discharge.  No photophobia  Neck: Normal range of motion. Neck supple.  Normal passive ROM without pain or stiffness  Cardiovascular: Normal rate, regular rhythm, normal heart sounds and intact distal pulses.   Pulmonary/Chest: Effort normal and breath sounds normal.  Abdominal: Soft. There is no tenderness.  Musculoskeletal: He exhibits no edema.  Neurological: He is alert and oriented to person, place, and time.  CN 2-12 grossly intact. 5/5 strength in all 4 extremities  Skin: Skin is warm and dry.  Nursing note and vitals reviewed.   ED Course  Procedures (including critical care time) Labs Review Labs Reviewed  BASIC METABOLIC PANEL - Abnormal; Notable for the following:    Glucose, Bld 112 (*)    GFR calc non Af Amer 77 (*)    GFR calc Af Amer 89 (*)    All other components within normal limits  CBC WITH DIFFERENTIAL - Abnormal; Notable for the following:    Hemoglobin 12.8 (*)    HCT 38.5 (*)    MCV 72.1 (*)    MCH 24.0 (*)    All other components within normal limits    Imaging Review Ct Angio Head W/cm &/or Wo Cm  03/11/2014   CLINICAL DATA:  Headache.  Left MCA aneurysm coiled October 2015.  EXAM: CT ANGIOGRAPHY HEAD AND NECK  TECHNIQUE: Multidetector CT imaging of the head and neck was performed using the standard protocol during bolus administration of intravenous contrast. Multiplanar CT image reconstructions and MIPs were obtained to evaluate the vascular anatomy. Carotid stenosis measurements (when applicable) are obtained utilizing NASCET criteria, using the distal internal carotid diameter as the denominator.  CONTRAST:  135mL OMNIPAQUE IOHEXOL 350 MG/ML SOLN  COMPARISON:  CT 12/30/2013  FINDINGS: CTA HEAD FINDINGS  Ventricle size is normal. Negative for acute hemorrhage. No acute infarct  or mass. Normal enhancement following contrast administration. Left MCA aneurysm coil and stent in satisfactory position.  Both vertebral arteries are widely patent. The basilar is widely patent. Superior cerebellar and posterior cerebral arteries are normal.  Right cavernous carotid widely patent. Right anterior and middle cerebral arteries are widely patent without stenosis  Left cavernous carotid widely patent. Left A1 and A2 segments are normal. Left M1 segment is widely patent. Coiled aneurysm at the left MCA bifurcation. Coil pack in good position. There is a stent adjacent to the coil pack extending into the parietal branch of the left MCA. This vessel is patent with good flow.  No new aneurysm. No residual aneurysm at the coiling site. Dural veins widely patent bilaterally  Review of the MIP images confirms the above findings.  CTA NECK FINDINGS  Mucous retention cysts in the maxillary sinus bilaterally. No mass or adenopathy in the neck. Lung apices are clear.  Carotid artery widely patent bilaterally. No carotid atherosclerotic disease or dissection. No carotid stenosis  Both vertebral arteries are widely patent to the basilar without stenosis or dissection.  Review of the MIP images confirms the above findings.  IMPRESSION: Satisfactory coiling and stenting of left MCA aneurysm. No recurrent aneurysm.  Negative for acute hemorrhage or infarction.  Normal appearance of the carotid and vertebral arteries bilaterally in the neck.   Electronically Signed   By: Franchot Gallo M.D.   On: 03/11/2014 13:23   Ct Angio Neck W/cm &/or Wo/cm  03/11/2014   CLINICAL DATA:  Headache.  Left MCA aneurysm coiled October 2015.  EXAM: CT ANGIOGRAPHY HEAD AND NECK  TECHNIQUE: Multidetector CT imaging of the head and neck was performed using the standard protocol during bolus administration of intravenous contrast. Multiplanar CT image reconstructions and MIPs were obtained to evaluate the vascular anatomy. Carotid  stenosis measurements (when applicable) are obtained utilizing NASCET criteria, using the distal internal carotid diameter as the denominator.  CONTRAST:  174mL OMNIPAQUE IOHEXOL 350 MG/ML SOLN  COMPARISON:  CT 12/30/2013  FINDINGS: CTA HEAD FINDINGS  Ventricle size is normal. Negative for acute hemorrhage. No acute infarct or mass. Normal enhancement following contrast administration. Left MCA aneurysm coil and stent in satisfactory position.  Both vertebral arteries are widely patent. The basilar is widely patent. Superior cerebellar and posterior cerebral arteries are normal.  Right cavernous carotid widely patent. Right anterior and middle cerebral arteries are widely patent without stenosis  Left cavernous carotid widely patent. Left A1 and A2 segments are normal. Left M1 segment is widely patent. Coiled aneurysm at the left MCA bifurcation. Coil pack in good position. There is a stent adjacent to the coil pack extending into the parietal branch of the left MCA. This vessel is patent with good flow.  No new aneurysm. No residual aneurysm at the coiling site. Dural veins widely patent bilaterally  Review of the MIP images confirms the above findings.  CTA NECK FINDINGS  Mucous retention cysts in the maxillary sinus bilaterally. No mass or adenopathy in the neck. Lung apices are clear.  Carotid artery widely patent bilaterally. No carotid atherosclerotic disease or dissection. No carotid stenosis  Both vertebral arteries are widely patent to the basilar without stenosis or dissection.  Review of the MIP images confirms the above findings.  IMPRESSION: Satisfactory coiling and stenting of left MCA aneurysm. No recurrent aneurysm.  Negative for acute hemorrhage or infarction.  Normal appearance of the carotid and vertebral arteries bilaterally in the neck.   Electronically Signed   By: Franchot Gallo M.D.   On: 03/11/2014 13:23     EKG Interpretation None      MDM   Final diagnoses:  Headache     Patient's headache is of unclear etiology. CT Angie oh obtained to rule out both bleed as well as clot given that he decrease his Plavix dose. He was given Reglan and Benadryl and fluids with good partial pain relief. He is neurologically intact. Patient feels well enough to go home. I discussed this case with the neurosurgeon on-call, Dr. Annette Stable, who reviewed the images and feels that this time he would not do any further workup and follow-up patient is an outpatient. Recommends NSAIDs.    Ephraim Hamburger, MD 03/11/14 [phone]

## 2014-03-11 NOTE — ED Notes (Signed)
PT presents to ED with complaints of headache. PT had aneurysm surgery last month, pt states he feels dizzy, eyes hurt.

## 2014-03-11 NOTE — ED Notes (Signed)
MD at bedside. 

## 2014-07-02 ENCOUNTER — Emergency Department (HOSPITAL_BASED_OUTPATIENT_CLINIC_OR_DEPARTMENT_OTHER)
Admission: EM | Admit: 2014-07-02 | Discharge: 2014-07-02 | Disposition: A | Discharge status: Home / Self Care | Payer: Medicaid Other | Attending: Emergency Medicine | Admitting: Emergency Medicine

## 2014-07-02 ENCOUNTER — Encounter (HOSPITAL_BASED_OUTPATIENT_CLINIC_OR_DEPARTMENT_OTHER): Payer: Self-pay | Admitting: *Deleted

## 2014-07-02 ENCOUNTER — Emergency Department (HOSPITAL_BASED_OUTPATIENT_CLINIC_OR_DEPARTMENT_OTHER): Payer: Medicaid Other

## 2014-07-02 DIAGNOSIS — Z7951 Long term (current) use of inhaled steroids: Secondary | ICD-10-CM | POA: Diagnosis not present

## 2014-07-02 DIAGNOSIS — R519 Headache, unspecified: Secondary | ICD-10-CM

## 2014-07-02 DIAGNOSIS — K219 Gastro-esophageal reflux disease without esophagitis: Secondary | ICD-10-CM | POA: Diagnosis not present

## 2014-07-02 DIAGNOSIS — Z7901 Long term (current) use of anticoagulants: Secondary | ICD-10-CM | POA: Insufficient documentation

## 2014-07-02 DIAGNOSIS — Z7982 Long term (current) use of aspirin: Secondary | ICD-10-CM | POA: Diagnosis not present

## 2014-07-02 DIAGNOSIS — F1210 Cannabis abuse, uncomplicated: Secondary | ICD-10-CM | POA: Insufficient documentation

## 2014-07-02 DIAGNOSIS — Z8679 Personal history of other diseases of the circulatory system: Secondary | ICD-10-CM | POA: Diagnosis not present

## 2014-07-02 DIAGNOSIS — Z87891 Personal history of nicotine dependence: Secondary | ICD-10-CM | POA: Diagnosis not present

## 2014-07-02 DIAGNOSIS — F419 Anxiety disorder, unspecified: Secondary | ICD-10-CM | POA: Insufficient documentation

## 2014-07-02 DIAGNOSIS — E785 Hyperlipidemia, unspecified: Secondary | ICD-10-CM | POA: Insufficient documentation

## 2014-07-02 DIAGNOSIS — Z79899 Other long term (current) drug therapy: Secondary | ICD-10-CM | POA: Insufficient documentation

## 2014-07-02 DIAGNOSIS — R51 Headache: Secondary | ICD-10-CM | POA: Diagnosis not present

## 2014-07-02 DIAGNOSIS — Z8501 Personal history of malignant neoplasm of esophagus: Secondary | ICD-10-CM | POA: Insufficient documentation

## 2014-07-02 DIAGNOSIS — Z8673 Personal history of transient ischemic attack (TIA), and cerebral infarction without residual deficits: Secondary | ICD-10-CM | POA: Insufficient documentation

## 2014-07-02 DIAGNOSIS — F1410 Cocaine abuse, uncomplicated: Secondary | ICD-10-CM | POA: Insufficient documentation

## 2014-07-02 DIAGNOSIS — H538 Other visual disturbances: Secondary | ICD-10-CM | POA: Diagnosis not present

## 2014-07-02 LAB — CBC
HEMATOCRIT: 37.9 % — AB (ref 39.0–52.0)
HEMOGLOBIN: 12.5 g/dL — AB (ref 13.0–17.0)
MCH: 23.7 pg — AB (ref 26.0–34.0)
MCHC: 33 g/dL (ref 30.0–36.0)
MCV: 71.8 fL — ABNORMAL LOW (ref 78.0–100.0)
Platelets: 274 10*3/uL (ref 150–400)
RBC: 5.28 MIL/uL (ref 4.22–5.81)
RDW: 15.6 % — ABNORMAL HIGH (ref 11.5–15.5)
WBC: 8.9 10*3/uL (ref 4.0–10.5)

## 2014-07-02 LAB — RAPID URINE DRUG SCREEN, HOSP PERFORMED
AMPHETAMINES: NOT DETECTED
BARBITURATES: NOT DETECTED
BENZODIAZEPINES: NOT DETECTED
Cocaine: POSITIVE — AB
Opiates: NOT DETECTED
Tetrahydrocannabinol: POSITIVE — AB

## 2014-07-02 LAB — BASIC METABOLIC PANEL
ANION GAP: 8 (ref 5–15)
BUN: 10 mg/dL (ref 6–23)
CALCIUM: 9.1 mg/dL (ref 8.4–10.5)
CO2: 22 mmol/L (ref 19–32)
Chloride: 111 mmol/L (ref 96–112)
Creatinine, Ser: 1.15 mg/dL (ref 0.50–1.35)
GFR calc non Af Amer: 73 mL/min — ABNORMAL LOW (ref >90)
GFR, EST AFRICAN AMERICAN: 84 mL/min — AB (ref >90)
GLUCOSE: 100 mg/dL — AB (ref 70–99)
Potassium: 3.3 mmol/L — ABNORMAL LOW (ref 3.5–5.1)
Sodium: 141 mmol/L (ref 135–145)

## 2014-07-02 LAB — URINALYSIS, ROUTINE W REFLEX MICROSCOPIC
BILIRUBIN URINE: NEGATIVE
Glucose, UA: NEGATIVE mg/dL
HGB URINE DIPSTICK: NEGATIVE
Ketones, ur: NEGATIVE mg/dL
Leukocytes, UA: NEGATIVE
Nitrite: NEGATIVE
PROTEIN: NEGATIVE mg/dL
SPECIFIC GRAVITY, URINE: 1.029 (ref 1.005–1.030)
Urobilinogen, UA: 1 mg/dL (ref 0.0–1.0)
pH: 5.5 (ref 5.0–8.0)

## 2014-07-02 MED ORDER — KETOROLAC TROMETHAMINE 30 MG/ML IJ SOLN
30.0000 mg | Freq: Once | INTRAMUSCULAR | Status: AC
  Administered 2014-07-02: 30 mg via INTRAVENOUS
  Filled 2014-07-02: qty 1

## 2014-07-02 MED ORDER — METOCLOPRAMIDE HCL 5 MG/ML IJ SOLN
10.0000 mg | Freq: Once | INTRAMUSCULAR | Status: AC
  Administered 2014-07-02: 10 mg via INTRAVENOUS
  Filled 2014-07-02: qty 2

## 2014-07-02 MED ORDER — SODIUM CHLORIDE 0.9 % IV SOLN
1000.0000 mL | Freq: Once | INTRAVENOUS | Status: AC
  Administered 2014-07-02: 1000 mL via INTRAVENOUS

## 2014-07-02 MED ORDER — DIPHENHYDRAMINE HCL 50 MG/ML IJ SOLN
25.0000 mg | Freq: Once | INTRAMUSCULAR | Status: AC
  Administered 2014-07-02: 25 mg via INTRAVENOUS
  Filled 2014-07-02: qty 1

## 2014-07-02 MED ORDER — DEXAMETHASONE SODIUM PHOSPHATE 10 MG/ML IJ SOLN
10.0000 mg | Freq: Once | INTRAMUSCULAR | Status: AC
  Administered 2014-07-02: 10 mg via INTRAVENOUS
  Filled 2014-07-02: qty 1

## 2014-07-02 NOTE — ED Notes (Signed)
Pt c/o migraine type HA x3 days.

## 2014-07-02 NOTE — ED Provider Notes (Signed)
CSN: [id number]     Arrival date & time 07/02/14  1820 History  This chart was scribed for Debby Freiberg, MD by Chester Holstein, ED Scribe. This patient was seen in room MH04/MH04 and the patient's care was started at 6:56 PM.     Chief Complaint  Patient presents with  . Headache     Patient is a 49 y.o. male presenting with headaches. The history is provided by the patient. No language interpreter was used.  Headache Pain location:  Frontal Quality:  Sharp Radiates to:  Does not radiate Onset quality:  Gradual Duration:  12 weeks Timing:  Intermittent Progression:  Worsening Chronicity:  New Similar to prior headaches: yes   Context comment:  Prior anterior cerebral aneurysm sp coiling wtih symptoms since that time Relieved by:  Nothing Worsened by:  Light Associated symptoms: dizziness and visual change (very brief darkening of vision)   Associated symptoms: no numbness and no weakness    HPI Comments: Jerauld Bostwick is a 49 y.o. male with PMHx of TIA, throat CA in remission, HLD, and DDD who presents to the Emergency Department complaining of intermittent pulsating temporal headache with onset 3 days ago. Pt notes associated bilateral changes in visions lasting several seconds described as vision going black. Pt states vision comes back if he shakes his head. Pt notes rubbing his face relieves the headache. Pt notes pain and changes are not similar to previous TIA symptoms. Post TIA deficients include decreased right sided sensation in face at baseline.  Pt with h/o aneurysm of anterior cerebral artery in October and states he has had intermittent lightheadedness and dizziness since surgery.  Pt states he has had recent medication changes in the last month. Pt denies LOC and numbness or weakness different than baseline. Pt has an appointment with his neurologist on 07/13/14.   Past Medical History  Diagnosis Date  . DDD (degenerative disc disease), cervical   . Acid reflux   .  Anxiety   . TIA (transient ischemic attack) 12/30/2013  . 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 Hyperlipemia   . Headache   . Aneurysm of anterior cerebral artery    Past Surgical History  Procedure Laterality Date  . Biopsy pharynx      esophageal  . Radiology with anesthesia N/A 01/31/2014    Procedure: [place];  Surgeon: Rob Hickman, MD;  Location: [facility];  Service: Radiology;  Laterality: N/A;  . Other surgical history      cerebral stent   Family History  Problem Relation Age of Onset  . Cancer Mother   . Stomach cancer Brother   . Testicular cancer Brother    History  Substance Use Topics  . Smoking status: Former Research scientist (life sciences)  . Smokeless tobacco: Former Systems developer    Quit date: 04/13/2013  . Alcohol Use: No    Review of Systems  Eyes: Positive for visual disturbance.  Neurological: Positive for dizziness, light-headedness and headaches. Negative for syncope, weakness and numbness.  All other systems reviewed and are negative.     Allergies  Vicodin and Tape  Home Medications   Prior to Admission medications   Medication Sig Start Date End Date Taking? Authorizing Provider  aspirin 325 MG tablet Take 1 tablet (325 mg total) by mouth daily. 01/01/14  Yes Velvet Bathe, MD  clopidogrel (PLAVIX) 75 MG tablet Take 75 mg by mouth daily.   Yes Historical Provider, MD  fluticasone (FLONASE) 50  MCG/ACT nasal spray Place 1 spray into both nostrils daily.    Yes Historical Provider, MD  megestrol (MEGACE) 40 MG/ML suspension Take 200 mg by mouth daily.    Yes Historical Provider, MD  omeprazole (PRILOSEC) 20 MG capsule Take 20 mg by mouth daily.   Yes Historical Provider, MD  pregabalin (LYRICA) 100 MG capsule Take 100 mg by mouth 2 (two) times daily.   Yes Historical Provider, MD  simvastatin (ZOCOR) 20 MG tablet Take 20 mg by mouth at bedtime.   Yes Historical Provider, MD  sodium chloride (OCEAN) 0.65 % SOLN nasal spray Place  1 spray into both nostrils daily.    Yes Historical Provider, MD  traMADol (ULTRAM) 50 MG tablet Take by mouth every 6 (six) hours as needed.   Yes Historical Provider, MD  ALPRAZolam (XANAX) 0.25 MG tablet Take 0.25 mg by mouth 2 (two) times daily.    Historical Provider, MD  oxyCODONE-acetaminophen (PERCOCET) 10-325 MG per tablet Take 1 tablet by mouth every 6 (six) hours as needed for pain.    Historical Provider, MD   BP 122/86 mmHg  Pulse 69  Temp(Src) 98.4 F (36.9 C) (Oral)  Resp 20  Ht 5\' 9"  (1.753 m)  Wt 195 lb (88.451 kg)  BMI 28.78 kg/m2  SpO2 100% Physical Exam  Constitutional: He is oriented to person, place, and time. He appears well-developed and well-nourished.  HENT:  Head: Normocephalic and atraumatic.  Eyes: Conjunctivae and EOM are normal. Pupils are equal, round, and reactive to light.  Fundoscopic exam:      The right eye shows no arteriolar narrowing, no AV nicking, no exudate, no hemorrhage and no papilledema.       The left eye shows no arteriolar narrowing, no AV nicking, no exudate, no hemorrhage and no papilledema.  Neck: Normal range of motion. Neck supple.  Cardiovascular: Normal rate, regular rhythm and normal heart sounds.   Pulmonary/Chest: Effort normal and breath sounds normal. No respiratory distress.  Abdominal: He exhibits no distension. There is no tenderness. There is no rebound and no guarding.  Musculoskeletal: Normal range of motion.  Neurological: He is alert and oriented to person, place, and time.  Skin: Skin is warm and dry.  Vitals reviewed.   ED Course  Procedures (including critical care time) DIAGNOSTIC STUDIES: Oxygen Saturation is 99% on room air, normal by my interpretation.    COORDINATION OF CARE: 7:06 PM Discussed treatment plan with patient at beside, the patient agrees with the plan and has no further questions at this time.   Labs Review Labs Reviewed  BASIC METABOLIC PANEL - Abnormal; Notable for the following:     Potassium 3.3 (*)    Glucose, Bld 100 (*)    GFR calc non Af Amer 73 (*)    GFR calc Af Amer 84 (*)    All other components within normal limits  CBC - Abnormal; Notable for the following:    Hemoglobin 12.5 (*)    HCT 37.9 (*)    MCV 71.8 (*)    MCH 23.7 (*)    RDW 15.6 (*)    All other components within normal limits  URINE RAPID DRUG SCREEN (HOSP PERFORMED) - Abnormal; Notable for the following:    Cocaine POSITIVE (*)    Tetrahydrocannabinol POSITIVE (*)    All other components within normal limits  URINALYSIS, ROUTINE W REFLEX MICROSCOPIC    Imaging Review Ct Head Wo Contrast  07/02/2014   CLINICAL DATA:  Headache 3  days. Bruising vision in both eyes. Worse on the left side.  EXAM: CT HEAD WITHOUT CONTRAST  TECHNIQUE: Contiguous axial images were obtained from the base of the skull through the vertex without intravenous contrast.  COMPARISON:  03/11/2014  FINDINGS: There is no evidence of mass effect, midline shift or extra-axial fluid collections. There is no evidence of a space-occupying lesion or intracranial hemorrhage. There is no evidence of a cortical-based area of acute infarction. There is evidence of prior left MCA aneurysm clipping.  The ventricles and sulci are appropriate for the patient's age. The basal cisterns are patent.  Visualized portions of the orbits are unremarkable. The mastoid sinuses are clear. Bilateral maxillary sinus mucous retention cysts.  The osseous structures are unremarkable.  IMPRESSION: 1. No acute intracranial pathology. 2. Prior left MCA aneurysm clipping.   Electronically Signed   By: Kathreen Devoid   On: 07/02/2014 19:42     EKG Interpretation None      MDM   Final diagnoses:  Acute nonintractable headache, unspecified headache type    49 y.o. male with pertinent PMH of prior TIA, anterior cerebral aneurysm, anxiety presents with chronic headache as above, acutely worsening over the last week. Patient states that he began to have  darkening of bilateral vision which lasted brief periods of time. He states this is happening 2-3 times per day. It is associated with paroxysmal headache. On arrival today the patient is vital signs physical exam as above. No focal neurologic deficit outside at patient baseline, which he states is left-sided sensory loss. This is subjective decreased, no objective loss.  Examination showed subjective decrease in all areas of left side including forehead.   Workup as above unremarkable. I spoke with the neurologist on-call and we agreed that it was unlikely that this was due to an aneurysm given the nature of symptoms.  Likely migraine related, however at this time we'll have the patient follow-up with his neurosurgeon tomorrow, as well as ophthalmology. Discharged home in stable condition with strict return precautions.    I have reviewed all laboratory and imaging studies if ordered as above  1. Acute nonintractable headache, unspecified headache type          Debby Freiberg, MD 07/02/14 2026

## 2014-07-02 NOTE — Discharge Instructions (Signed)

## 2015-02-27 ENCOUNTER — Encounter (HOSPITAL_BASED_OUTPATIENT_CLINIC_OR_DEPARTMENT_OTHER): Payer: Self-pay | Admitting: *Deleted

## 2015-02-27 ENCOUNTER — Emergency Department (HOSPITAL_BASED_OUTPATIENT_CLINIC_OR_DEPARTMENT_OTHER)
Admission: EM | Admit: 2015-02-27 | Discharge: 2015-02-27 | Disposition: A | Discharge status: Home / Self Care | Payer: Medicaid Other | Attending: Emergency Medicine | Admitting: Emergency Medicine

## 2015-02-27 DIAGNOSIS — M542 Cervicalgia: Secondary | ICD-10-CM | POA: Diagnosis present

## 2015-02-27 DIAGNOSIS — Z79899 Other long term (current) drug therapy: Secondary | ICD-10-CM | POA: Insufficient documentation

## 2015-02-27 DIAGNOSIS — Z7951 Long term (current) use of inhaled steroids: Secondary | ICD-10-CM | POA: Diagnosis not present

## 2015-02-27 DIAGNOSIS — Z8673 Personal history of transient ischemic attack (TIA), and cerebral infarction without residual deficits: Secondary | ICD-10-CM | POA: Insufficient documentation

## 2015-02-27 DIAGNOSIS — E785 Hyperlipidemia, unspecified: Secondary | ICD-10-CM | POA: Diagnosis not present

## 2015-02-27 DIAGNOSIS — Z91040 Latex allergy status: Secondary | ICD-10-CM | POA: Diagnosis not present

## 2015-02-27 DIAGNOSIS — Z87891 Personal history of nicotine dependence: Secondary | ICD-10-CM | POA: Insufficient documentation

## 2015-02-27 DIAGNOSIS — Z923 Personal history of irradiation: Secondary | ICD-10-CM | POA: Insufficient documentation

## 2015-02-27 DIAGNOSIS — Z7982 Long term (current) use of aspirin: Secondary | ICD-10-CM | POA: Diagnosis not present

## 2015-02-27 DIAGNOSIS — Z8501 Personal history of malignant neoplasm of esophagus: Secondary | ICD-10-CM | POA: Insufficient documentation

## 2015-02-27 DIAGNOSIS — Z7902 Long term (current) use of antithrombotics/antiplatelets: Secondary | ICD-10-CM | POA: Insufficient documentation

## 2015-02-27 DIAGNOSIS — M546 Pain in thoracic spine: Secondary | ICD-10-CM | POA: Insufficient documentation

## 2015-02-27 DIAGNOSIS — M62838 Other muscle spasm: Secondary | ICD-10-CM | POA: Diagnosis not present

## 2015-02-27 DIAGNOSIS — K219 Gastro-esophageal reflux disease without esophagitis: Secondary | ICD-10-CM | POA: Diagnosis not present

## 2015-02-27 DIAGNOSIS — F419 Anxiety disorder, unspecified: Secondary | ICD-10-CM | POA: Insufficient documentation

## 2015-02-27 MED ORDER — DIAZEPAM 2 MG PO TABS
2.0000 mg | ORAL_TABLET | Freq: Once | ORAL | Status: AC
  Administered 2015-02-27: 2 mg via ORAL
  Filled 2015-02-27: qty 1

## 2015-02-27 MED ORDER — BUPIVACAINE HCL 0.5 % IJ SOLN
INTRAMUSCULAR | Status: AC
  Administered 2015-02-27: 1 mL
  Filled 2015-02-27: qty 1

## 2015-02-27 MED ORDER — BUPIVACAINE HCL 0.25 % IJ SOLN: 10.0000 mL | Freq: Once | INTRAMUSCULAR | Status: DC

## 2015-02-27 MED ORDER — BUPIVACAINE HCL (PF) 0.5 % IJ SOLN: 10.0000 mL | Freq: Once | INTRAMUSCULAR | Status: AC

## 2015-02-27 NOTE — ED Notes (Signed)
MD at bedside. 

## 2015-02-27 NOTE — ED Notes (Signed)
Reports neck and back pain x 4 days- hx of same- states "flares up when it wants"- denies new injury

## 2015-02-27 NOTE — Discharge Instructions (Signed)

## 2015-02-27 NOTE — ED Provider Notes (Signed)
CSN: [id number]     Arrival date & time 02/27/15  1808 History  By signing my name below, I, Bradley Brock, attest that this documentation has been prepared under the direction and in the presence of Bradley Grosser, MD. Electronically Signed: Soijett Brock, ED Scribe. 02/27/2015. 7:31 PM.  Chief Complaint  Patient presents with  . Neck Pain  . Back Pain      Patient is a 49 y.o. male presenting with back pain. The history is provided by the patient. No language interpreter was used.  Back Pain Pain location: upper. Quality:  Cramping Radiates to:  Does not radiate Pain severity:  Moderate Pain is:  Unable to specify Onset quality:  Gradual Duration:  4 days Timing:  Constant Progression:  Unchanged Chronicity:  Recurrent Context comment:  Medical hx of DDD  Relieved by:  None tried Worsened by:  Nothing tried Ineffective treatments:  None tried Risk factors: hx of cancer   Risk factors: no recent surgery     HPI Comments: Bradley Brock is a 49 y.o. male with a medical hx of DDD who presents to the Emergency Department complaining of intermittent, upper, moderate, back pain onset 4 days. He notes that he is here because he is having a spasm. He notes that he went to the chiropractor yesterday and had his symptoms relieved for 30 minutes. He reports that the back pain does radiate to his left neck and left shoulder. He states that he is having associated symptoms of neck pain. He states that he has tried Rx flexeril and Rx prednisone with no relief for his symptoms.  Past Medical History  Diagnosis Date  . DDD (degenerative disc disease), cervical   . Acid reflux   . Anxiety   . TIA (transient ischemic attack) 12/30/2013  . Cancer [facility])     esophageal cancer - radiation on throat  . Hyperlipemia   . Headache   . Aneurysm of anterior cerebral artery    Past Surgical History  Procedure Laterality Date  . Biopsy pharynx      esophageal  . Radiology with anesthesia N/A  01/31/2014    Procedure: [place];  Surgeon: Rob Hickman, MD;  Location: [place];  Service: Radiology;  Laterality: N/A;  . Other surgical history      cerebral stent   Family History  Problem Relation Age of Onset  . Cancer Mother   . Stomach cancer Brother   . Testicular cancer Brother    Social History  Substance Use Topics  . Smoking status: Former Research scientist (life sciences)  . Smokeless tobacco: Former Systems developer    Quit date: 04/13/2013  . Alcohol Use: No    Review of Systems  Musculoskeletal: Positive for back pain.  All other systems reviewed and are negative.     Allergies  Latex; Vicodin; and Tape  Home Medications   Prior to Admission medications   Medication Sig Start Date End Date Taking? Authorizing Provider  ALPRAZolam (XANAX) 0.25 MG tablet Take 0.25 mg by mouth 2 (two) times daily.   Yes Historical Provider, MD  aspirin 325 MG tablet Take 1 tablet (325 mg total) by mouth daily. 01/01/14  Yes Velvet Bathe, MD  cyclobenzaprine (FLEXERIL) 10 MG tablet Take 10 mg by mouth 3 (three) times daily as needed for muscle spasms.   Yes Historical Provider, MD  omeprazole (PRILOSEC) 20 MG capsule Take 20 mg by mouth daily.   Yes Historical Provider, MD  traMADol (ULTRAM) 50 MG tablet Take  by mouth every 6 (six) hours as needed.   Yes Historical Provider, MD  clopidogrel (PLAVIX) 75 MG tablet Take 75 mg by mouth daily.    Historical Provider, MD  fluticasone (FLONASE) 50 MCG/ACT nasal spray Place 1 spray into both nostrils daily.     Historical Provider, MD  megestrol (MEGACE) 40 MG/ML suspension Take 200 mg by mouth daily.     Historical Provider, MD  oxyCODONE-acetaminophen (PERCOCET) 10-325 MG per tablet Take 1 tablet by mouth every 6 (six) hours as needed for pain.    Historical Provider, MD  pregabalin (LYRICA) 100 MG capsule Take 100 mg by mouth 2 (two) times daily.    Historical Provider, MD  simvastatin (ZOCOR) 20 MG tablet Take 20 mg by mouth at  bedtime.    Historical Provider, MD  sodium chloride (OCEAN) 0.65 % SOLN nasal spray Place 1 spray into both nostrils daily.     Historical Provider, MD   BP 99/71 mmHg  Pulse 80  Temp(Src) 98.1 F (36.7 C) (Oral)  Resp 18  Ht 5\' 9"  (1.753 m)  Wt 156 lb (70.761 kg)  BMI 23.03 kg/m2  SpO2 100% Physical Exam  Constitutional: He is oriented to person, place, and time. He appears well-developed and well-nourished. No distress.  HENT:  Head: Normocephalic and atraumatic.  Eyes: EOM are normal.  Neck: Neck supple.  Cardiovascular: Normal rate.   Pulmonary/Chest: Effort normal. No respiratory distress.  Musculoskeletal: Normal range of motion.  Muscle spasm lateral to left scapula and over superior portion of left trapezius  Neurological: He is alert and oriented to person, place, and time.  Skin: Skin is warm and dry.  Psychiatric: He has a normal mood and affect. His behavior is normal.  Nursing note and vitals reviewed.   ED Course  Procedures (including critical care time)  Procedure Note: Trigger Point Injection for Myofascial pain  Performed by Dr. Laneta Simmers Indication: muscle/myofascial pain Muscle body and tendon sheath of the left trapezius and subscapularis muscle(s) were injected with 0.5% bupivacaine under sterile technique for release of muscle spasm/pain. Patient tolerated well with immediate improvement of symptoms and no immediate complications following procedure.  CPT Code:  1 or 2 muscle bodies: 20552 3 or more: 20553  DIAGNOSTIC STUDIES: Oxygen Saturation is 100% on RA, nl by my interpretation.    COORDINATION OF CARE: 7:30 PM Discussed treatment plan with pt at bedside which includes bupivacaine injection and valium and pt agreed to plan.    Labs Review Labs Reviewed - No data to display  Imaging Review No results found.    EKG Interpretation None      MDM   Final diagnoses:  Muscle spasm of left shoulder    50 year old male presents with  diffuse left shoulder and back pain. He states he has history of degenerative C-spine disease and previously was treated in a pain clinic. He has notable point tenderness over multiple muscles of the left neck, shoulder and back. No focal weakness or neurologic deficits on exam. Sensation fully intact. He was given multiple trigger point injections with immediate relief of his symptoms. No imaging indicated currently. Provided Valium low-dose for muscle spasm and given instructions for stretching and strengthening of the shoulder to help with this issue going forward. Plan to follow up with PCP as needed and return precautions discussed for worsening or new concerning symptoms.   I personally performed the services described in this documentation, which was scribed in my presence. The recorded information has been  reviewed and is accurate.      Bradley Grosser, MD 02/28/15 [phone]

## 2015-08-17 ENCOUNTER — Emergency Department (HOSPITAL_BASED_OUTPATIENT_CLINIC_OR_DEPARTMENT_OTHER)
Admission: EM | Admit: 2015-08-17 | Discharge: 2015-08-17 | Disposition: A | Discharge status: Home / Self Care | Payer: Medicaid Other | Attending: Emergency Medicine | Admitting: Emergency Medicine

## 2015-08-17 ENCOUNTER — Encounter (HOSPITAL_BASED_OUTPATIENT_CLINIC_OR_DEPARTMENT_OTHER): Payer: Self-pay | Admitting: Emergency Medicine

## 2015-08-17 DIAGNOSIS — F17200 Nicotine dependence, unspecified, uncomplicated: Secondary | ICD-10-CM | POA: Diagnosis not present

## 2015-08-17 DIAGNOSIS — R1084 Generalized abdominal pain: Secondary | ICD-10-CM | POA: Diagnosis present

## 2015-08-17 DIAGNOSIS — E785 Hyperlipidemia, unspecified: Secondary | ICD-10-CM | POA: Diagnosis not present

## 2015-08-17 DIAGNOSIS — Z8673 Personal history of transient ischemic attack (TIA), and cerebral infarction without residual deficits: Secondary | ICD-10-CM | POA: Diagnosis not present

## 2015-08-17 NOTE — ED Notes (Addendum)
Bradley Brock after explanation by Dr Randal Buba and this writer continues to refuse all testing stating your not sticking me with a needle because it takes weeks for me to heal. The righ to [facility] discharge was explained and signature for AMA was requested which Bradley Brock refused to sign stating "I'm not signing nothing". Bradley Brock put on his clothes and left the ED Dr Randal Buba at bedside and is aware of all events.

## 2015-08-17 NOTE — ED Notes (Addendum)
Patient states that at about 5 pm, he started to have pain to his mid abdominal that he describes as sharp stabbing. The patient denies any N/V/D, reports LBM about an hour ago and it was very hard. Has attempted several OTC methods to help and the pain has not decreased.

## 2015-08-17 NOTE — ED Provider Notes (Signed)
CSN: [id number]     Arrival date & time 08/17/15  0046 History   First MD Initiated Contact with Patient 08/17/15 0106     Chief Complaint  Patient presents with  . Abdominal Pain     (Consider location/radiation/quality/duration/timing/severity/associated sxs/prior Treatment) Patient is a 50 y.o. male presenting with abdominal pain. The history is provided by the patient.  Abdominal Pain Pain location:  Generalized Pain quality: sharp and stabbing   Pain radiates to:  Does not radiate Pain severity:  Severe Onset quality:  Sudden Duration:  8 hours Timing:  Constant Progression:  Unchanged Chronicity:  New Context: not alcohol use and not trauma   Relieved by:  Nothing Worsened by:  Nothing tried Ineffective treatments:  None tried Associated symptoms: no anorexia, no constipation, no dysuria, no fever, no flatus, no hematuria and no vomiting   Risk factors: no recent hospitalization     Past Medical History  Diagnosis Date  . DDD (degenerative disc disease), cervical   . Acid reflux   . Anxiety   . TIA (transient ischemic attack) 12/30/2013  . Cancer [facility])     esophageal cancer - radiation on throat  . Hyperlipemia   . Headache   . Aneurysm of anterior cerebral artery    Past Surgical History  Procedure Laterality Date  . Biopsy pharynx      esophageal  . Radiology with anesthesia N/A 01/31/2014    Procedure: [place];  Surgeon: Rob Hickman, MD;  Location: [place];  Service: Radiology;  Laterality: N/A;  . Other surgical history      cerebral stent   Family History  Problem Relation Age of Onset  . Cancer Mother   . Stomach cancer Brother   . Testicular cancer Brother    Social History  Substance Use Topics  . Smoking status: Current Every Day Smoker  . Smokeless tobacco: Former Systems developer    Quit date: 04/13/2013  . Alcohol Use: No    Review of Systems  Constitutional: Negative for fever.  Gastrointestinal: Positive for  abdominal pain. Negative for vomiting, constipation, blood in stool, anorexia and flatus.  Genitourinary: Negative for dysuria and hematuria.  All other systems reviewed and are negative.     Allergies  Latex; Vicodin; and Tape  Home Medications   Prior to Admission medications   Medication Sig Start Date End Date Taking? Authorizing Provider  ALPRAZolam (XANAX) 0.25 MG tablet Take 0.25 mg by mouth 2 (two) times daily.    Historical Provider, MD  aspirin 325 MG tablet Take 1 tablet (325 mg total) by mouth daily. 01/01/14   Velvet Bathe, MD  clopidogrel (PLAVIX) 75 MG tablet Take 75 mg by mouth daily.    Historical Provider, MD  cyclobenzaprine (FLEXERIL) 10 MG tablet Take 10 mg by mouth 3 (three) times daily as needed for muscle spasms.    Historical Provider, MD  fluticasone (FLONASE) 50 MCG/ACT nasal spray Place 1 spray into both nostrils daily.     Historical Provider, MD  megestrol (MEGACE) 40 MG/ML suspension Take 200 mg by mouth daily.     Historical Provider, MD  omeprazole (PRILOSEC) 20 MG capsule Take 20 mg by mouth daily.    Historical Provider, MD  oxyCODONE-acetaminophen (PERCOCET) 10-325 MG per tablet Take 1 tablet by mouth every 6 (six) hours as needed for pain.    Historical Provider, MD  pregabalin (LYRICA) 100 MG capsule Take 100 mg by mouth 2 (two) times daily.    Historical Provider,  MD  simvastatin (ZOCOR) 20 MG tablet Take 20 mg by mouth at bedtime.    Historical Provider, MD  sodium chloride (OCEAN) 0.65 % SOLN nasal spray Place 1 spray into both nostrils daily.     Historical Provider, MD  traMADol (ULTRAM) 50 MG tablet Take by mouth every 6 (six) hours as needed.    Historical Provider, MD   BP 105/70 mmHg  Pulse 84  Temp(Src) 98.5 F (36.9 C) (Oral)  Resp 18  Ht 5\' 9"  (1.753 m)  Wt 145 lb (65.772 kg)  BMI 21.40 kg/m2  SpO2 100% Physical Exam  Constitutional: He is oriented to person, place, and time. He appears well-developed and well-nourished. No  distress.  HENT:  Head: Normocephalic and atraumatic.  Mouth/Throat: Oropharynx is clear and moist.  Eyes: Conjunctivae are normal. Pupils are equal, round, and reactive to light.  Neck: Normal range of motion. Neck supple.  Cardiovascular: Normal rate, regular rhythm and intact distal pulses.   Pulmonary/Chest: Effort normal and breath sounds normal. No respiratory distress. He has no wheezes. He has no rales.  Abdominal: Soft. He exhibits no distension. Bowel sounds are increased. There is tenderness in the left lower quadrant. There is no rebound, no guarding, no tenderness at McBurney's point and negative Murphy's sign.  Musculoskeletal: Normal range of motion.  Neurological: He is alert and oriented to person, place, and time.  Skin: Skin is warm and dry.  Psychiatric: He has a normal mood and affect.    ED Course  Procedures (including critical care time) Labs Review Labs Reviewed - No data to display  Imaging Review No results found. I have personally reviewed and evaluated these images and lab results as part of my medical decision-making.   EKG Interpretation None      MDM   Final diagnoses:  Generalized abdominal pain    Finished exam and returned to office to put lab orders and medications in for patient and informed by Mortimer Fries, Nurse that patient is refusing all interventions.    States he will bleed from IV on plavix.  He just wants something for constipation.  EDP explained that it may not be constipation as patient already said he was not constipated.  EDP is trying to ensure patient's good health and safety and to exclude all life threatening causes of patient's pain.  EDP explained in detail that I cannot state that it is constipation without some work up that it is neither ethical or legal and that we would like to work with patient to ensure that we are helping both his pain ad trying to ensure that no life threatening conditions exist: bleeding internally,  diverticulitis, AAA, Dissection, appendicitis etc.     Patient is AO4 and understands the risks of not investigating the problem.  The risks are but are not limited to: death, sepsis, abdominal organ bleeding abdominal organ perforation, and prolonged morbidity.  Patient verbalizes understanding and signs AMA.  He is welcome to return at any time    Shifa Brisbon, MD 08/17/15 0128

## 2016-08-29 IMAGING — XA IR TRANSCATH EMBOLIZATION
1 series · 8 of 24 positions shown · IV contrast (IODINE)
Comparison: none

CLINICAL DATA: Intermittent headaches and dizziness. Discovery of
an irregular lobulated aneurysm in the left MCA bifurcation region.

[Series 300: neuro · 8 of 416 slices shown]
[im 19/416]
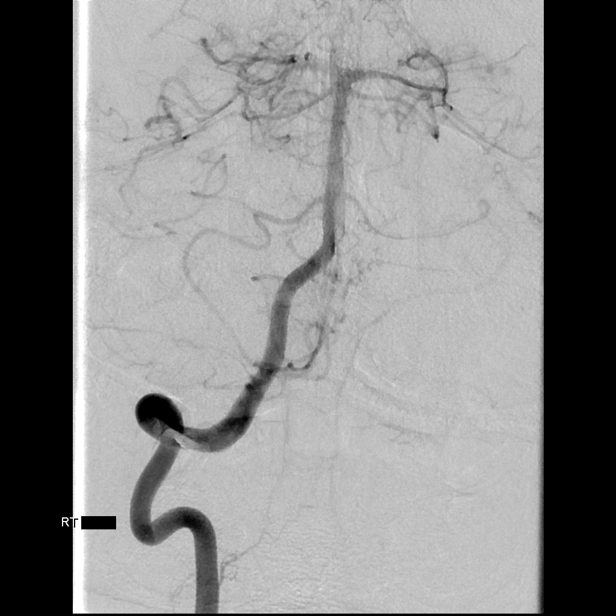
[im 73/416]
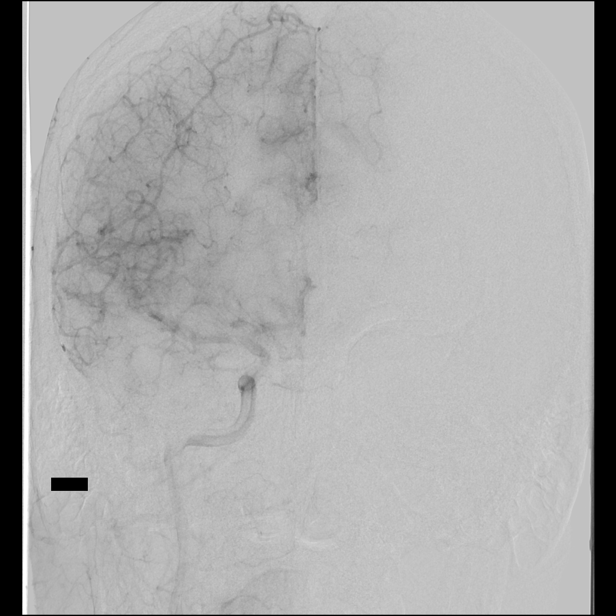
[im 127/416]
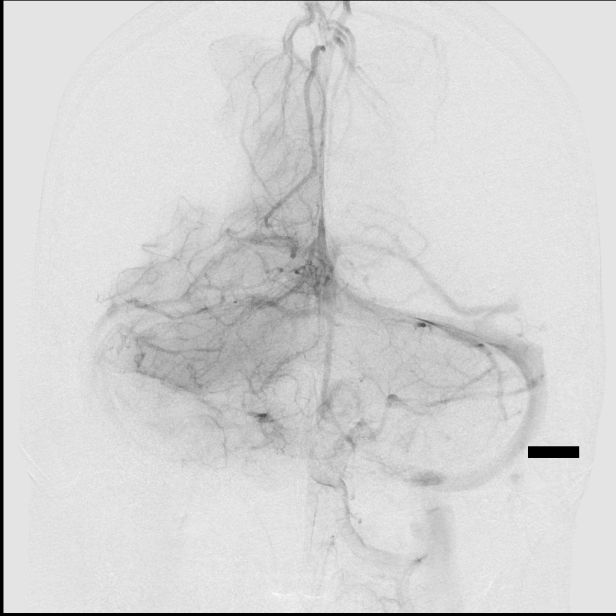
[im 181/416]
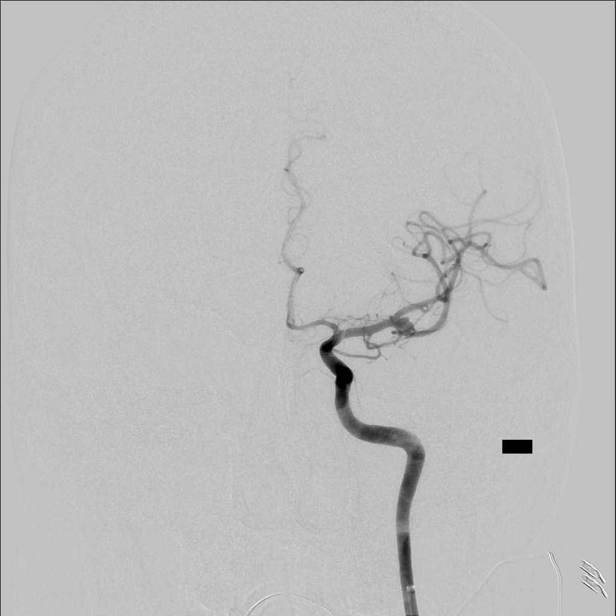
[im 235/416]
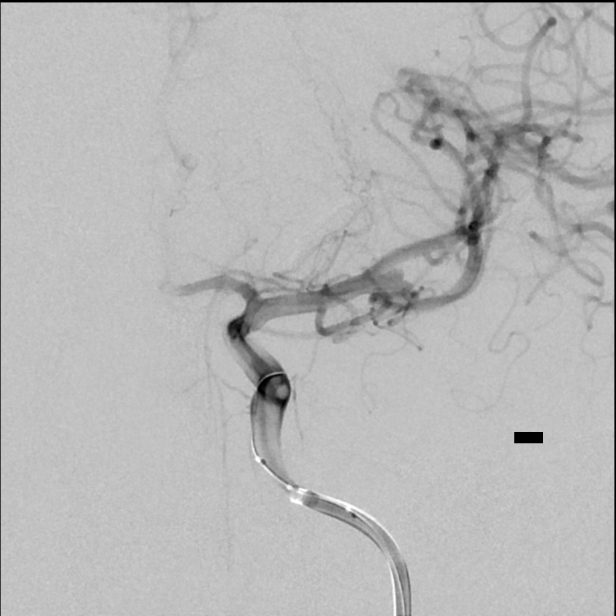
[im 289/416]
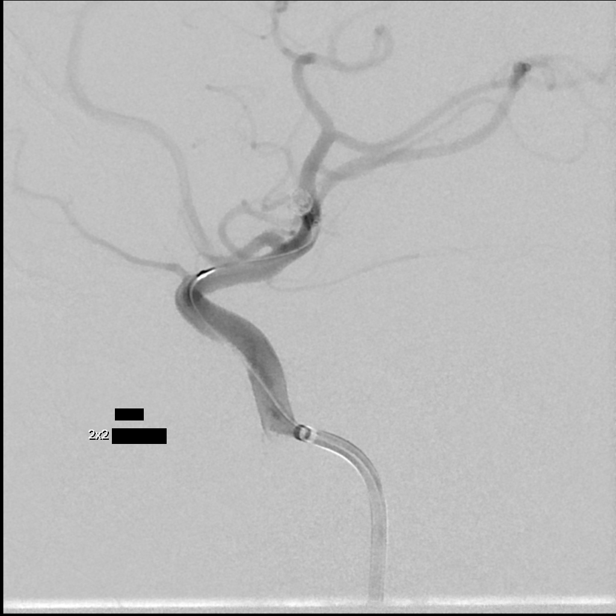
[im 343/416]
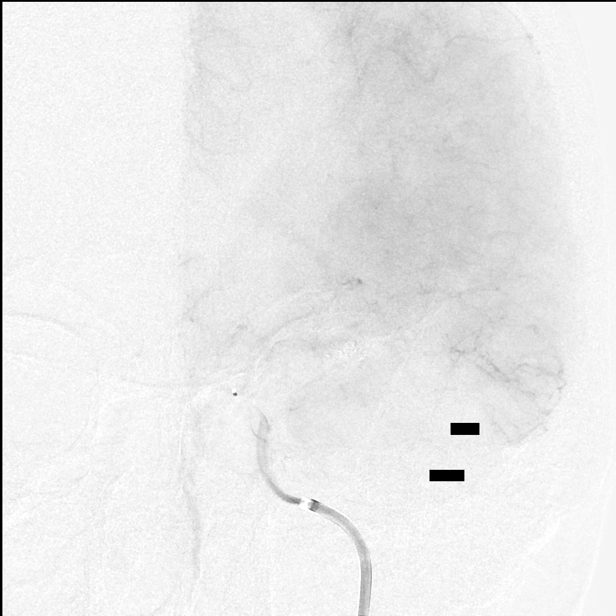
[im 397/416]
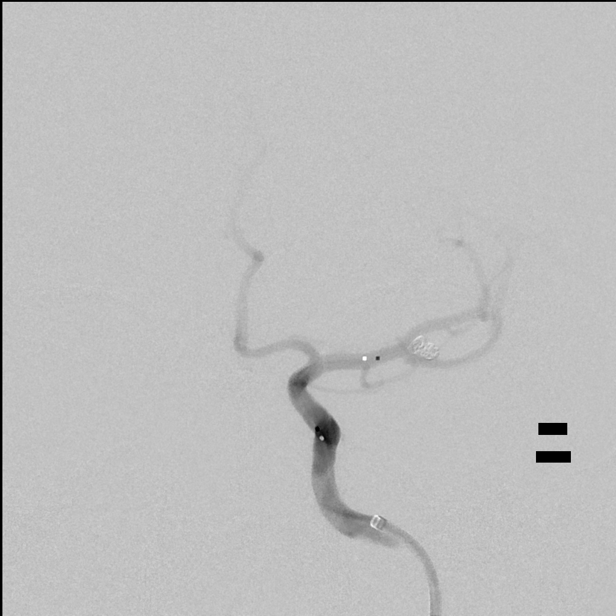

[8 of 24 positions shown; findings below may reference images not displayed]

EXAM:
TRANSCATHETER THERAPY EMBOLIZATION WITH FOUR VESSEL CEREBRAL
ARTERIOGRAM FOLLOWED BY ENDOVASCULAR COMPLETE OBLITERATION OF
UNRUPTURED LEFT MIDDLE CEREBRAL ARTERY BILOBED IRREGULAR ANEURYSM IN
THE BIFURCATION REGION:

ANESTHESIA/SEDATION:
General anesthesia.

MEDICATIONS:
As per general anesthesia.

CONTRAST:  100mL OMNIPAQUE IOHEXOL 300 MG/ML  SOLN

PROCEDURE:
Following a full explanation of the procedure along with the
potential associated complications, an informed witnessed consent
was obtained.

The right groin was prepped and draped in the usual sterile fashion.
Thereafter using modified Seldinger technique, transfemoral access
into the right common femoral artery was obtained without
difficulty. Over a 0.035 inch guidewire, a 5 French Pinnacle sheath
was inserted. Through this, and also over a 0.035 inch guidewire, a
5 French JB1 catheter was advanced to the aortic arch region and
selectively positioned in the right vertebral artery, the right
common carotid artery, the left vertebral artery and the left common
carotid artery. 3D rotational arteriograms centered intracranially
from the left common carotid artery injection was also obtained.

COMPLICATIONS:
None immediate.
FINDINGS: The right vertebral artery origin is normal.

The vessel opacifies normally to the cranial skull base. Normal
opacification is seen in the right posterior inferior cerebellar
artery and the right vertebrobasilar junction.

The basilar artery, the posterior cerebral arteries, the superior
cerebellar arteries and the anterior inferior cerebellar arteries
opacify normally into the capillary and venous phases.

A right-sided posterior inferior cerebellar artery/anterior inferior
cerebellar artery complex is seen.

Inflow of non-opacified blood is seen in the basilar artery from the
contralateral vertebral artery.

The right common carotid arteriogram demonstrates the right external
carotid artery and its major branches to be normal.

The right internal carotid artery at the bulb to the cranial skull
base opacifies normally.

The petrous, the cavernous and the supraclinoid segments are widely
patent.

The right middle and the right anterior cerebral artery opacify
normally into the capillary and venous phases. Cross filling of the
left anterior cerebral artery distal to the A2 segment is seen via
the anterior communicating artery.

The left vertebral artery origin is normal.

The vessel opacifies normally to the cranial skull base.

There is normal opacification of the left posterior inferior
cerebellar artery and the left vertebrobasilar junction.

The basilar artery, the posterior cerebral arteries, the superior
cerebellar arteries and the anterior-inferior cerebellar arteries
opacify normally into the capillary and venous phases.

The left common carotid arteriogram demonstrates the left external
carotid artery and its major branches to be normal.

The left internal carotid artery at the bulb to the cranial skull
base opacifies normally.

The petrous, cavernous and supraclinoid segments are widely patent.

The left middle and the left anterior cerebral artery opacify
normally into the capillary and venous phases.

Arising in the right MCA bifurcation region is a bilobed irregular
saccular aneurysm projecting laterally. 3D rotational arteriogram
with reconstruction demonstrates a 4.7 mm x 3.5 mm irregular bilobed
wide neck aneurysm.

ENDOVASCULAR COMPLETE OBLITERATION OF LEFT MIDDLE CEREBRAL ARTERY
BIFURCATION REGION ANEURYSM:

The angiographic findings were again discussed with the patient and
the patient's family.

It was decided to proceed with endovascular treatment of the
aneurysm which may require primary coiling versus stent assisted
coiling versus staged endovascular treatment.

The risks of aneurysm rupture with potential for death,
thromboembolic stroke and ischemic stroke were all discussed in
detail again.

Informed consent was obtained.

The patient was then put under general anesthesia by the [REDACTED] at [HOSPITAL].

The diagnostic JB1 catheter in the left common carotid artery was
exchanged over a 0.035 inch 300 cm Rosen exchange guidewire for a 65
cm 6 French Arrow neurovascular sheath using biplane roadmap
technique and constant fluoroscopic guidance. Good aspiration was
obtained from the side port of the neurovascular sheath. A gentle
constant injection demonstrated no evidence of spasms, dissections
or of intraluminal filling defects.

Over the Rosen exchange guidewire, a 95 cm 6 French Benchmark guide
catheter was then advanced to just proximal to the left common
carotid bifurcation. The guidewire was removed. Good aspiration was
obtained from the hub of the 6 French guide catheter. A gentle
contrast injection demonstrates no evidence of spasm, dissections or
of intraluminal filling defects.

Over a 0.035 inch Roadrunner guidewire, using biplane roadmap
technique and constant fluoroscopic guidance, the 6 French Benchmark
guide catheter was then advanced to the distal cervical petrous
junction of the left internal carotid artery. The guidewire was
removed. Good aspiration was obtained from the hub of the 6 French
guide catheter. A gentle contrast injection demonstrated no evidence
of spasm, dissections or of intraluminal filling defects.

No change was seen in the intracranial circulation.

At this time a 4 mm x 11 mm ScepterXC balloon was prepped with
normal heparinized saline to clear the air by holding it vertically.

This was then submerged in a 50% contrast and a 50% heparinized
saline infusion and deflated.

Using biplane roadmap technique and constant fluoroscopic guidance,
in a coaxial manner and with constant heparinized saline fusion, the
ScepterXC balloon catheter was then advanced over a 0.014 inch
Softip Synchro micro guidewire to the distal end of the guide
catheter. With the micro guidewire leading with a J-tip
configuration, to avoid dissections or inducing spasm, the
combination was navigated to the supraclinoid left ICA. The micro
guidewire was then gently advanced into the M1 segment followed by
the micro catheter. The micro guidewire was then gently torqued and
manipulated into the inferior division of the left middle cerebral
artery and advanced to the M2 M3 region followed by the balloon
micro catheter without difficulty.

The guidewire was then removed. Good aspiration was obtained from
the hub of the ScepterXC balloon catheter. A gentle constant
injection demonstrated safe positioning of the distal end of the
balloon micro catheter.

This was then connected to continuous heparinized saline infusion.

At this time via a separate bore on the double Tuohy-Borst, a
Headway 2 tip micro catheter which had been steamed-shaped in the
usual fashion was then advanced over a 0.014 inch Softip Synchro
micro guidewire to the distal end of the guide catheter.

With the micro guidewire leading with a J-tip configuration, the
combination was navigated to the supraclinoid left ICA. The M1
segment was then entered with the micro guidewire followed by the
micro catheter. The micro guidewire was then advanced into the
central portion of the aneurysm without difficulty followed by the
micro catheter. Under constant visualization, the micro guidewire
was then gently retrieved ensuring no sudden forward motion or
movement of the tip of the intra aneurysmal micro catheter. None was
observed. Good aspiration was obtained from the hub of the intra
aneurysmal micro catheter. This was then connected to continuous
heparinized saline infusion.

Control arteriogram performed through the 6 French guide catheter
demonstrated safe positioning of the micro catheter tip for
embolization to begin. The first coil utilized was a 3.5 mm x 5 cm
Hypersoft 3D coil. This was advanced in a coaxial manner and with
constant heparinized saline infusion using biplane roadmap technique
and constant fluoroscopic guidance to the distal end into the intra
aneurysmal micro catheter. The coil was then advanced into the
aneurysm with a nice basket configuration being obtained.

Prior to detachment, a control arteriogram demonstrated safe
apposition of the basket within the aneurysm for detachment.

This coil was detached without difficulty. This was then followed by
a 2.5 mm x 4 cm Target 360 Nano coil, and a 2 mm x 2 cm Hypersoft
helical coil. Each of these were advanced into the aneurysm using
biplane roadmap technique and constant fluoroscopic guidance. Prior
to detachment, control arteriograms were performed to ensure safe
positioning of the coil mass.

At the end of the final coil there was complete obliteration of the
aneurysm. At this time, a 2.5 mm x 15 mm Ferienhaus Erxleben stent was prepped
and purged with heparinized saline infusion in its housing. It was
then advanced in a coaxial manner and with constant heparinized
saline infusion using biplane roadmap technique and constant
fluoroscopic guidance to the distal end of the ScepterXC balloon
micro catheter.

Once the distal end of the stent was outside the tip of the micro
catheter, the entire system was then straightened. This was then
slightly retrieved proximally such that an optimal point of
placement of the proximal stent was established.

The O-ring on the delivery micro catheter and the delivery micro
guidewire were then released.

With slight forward gentle traction with the right hand on the
delivery micro guidewire, with the left hand, using a pull and push
technique, the stent was slowly retrieved first distally and then
proximally.

Significant tension was seen to have developed within the entire
system.

The proximal portion of the stent was then deployed. Forward motion
of the stent was seen because of the antegrade inertia.

Performed through the 6 French guide catheter demonstrated excellent
apposition distally, with excellent coverage of the neck of the
aneurysm, with the stent positioned just proximal to the left MCA
bifurcation.

With fluoroscopic guidance, the micro guidewire of the stent was
then retrieved ensuring no movement of the stent. Control
arteriograms were then performed through the 6 French guide catheter
in the left internal carotid artery at 15 and 30 minutes after the
deployment of the stent and the coils. There continued to be no
evidence of intraluminal filling defects or instability of the coil
mass.

No acute hemodynamic or neurologic changes were seen. No
angiographic evidence of extravasation of contrast was seen.

The 6 French guide catheter and the 6 French neurovascular sheath
were then retrieved into the abdominal aorta and exchanged over a
J-tip guidewire for a 6 French Pinnacle sheath. This was then
successfully removed with the successful application of an external
closure device.

The patient's ACT throughout the procedure was approximately 180
seconds. The patient's general anesthetic was then reversed and the
patient was extubated without difficulty. Upon recovery the patient
demonstrated no new neurological signs or symptoms.

The patient was then transported to the neuro ICU to be continue on
IV heparin. The patient was to continue with close neurological
observations with close monitoring of the patient's blood pressure.

Later the patient was completely normal on neurological and systemic
examination. The patient complained of a slight sore throat which
responded to ice chips.

Overnight the patient developed a left-sided periorbital pain which
responded to Tylenol and Toradol IV. The following day the patient's
arterial line and Foley catheter were removed. The patient then
ambulated independently. The patient's groin site appeared soft
without evidence of a hematoma.

He was discharged on aspirin 325 mg a day, and Plavix 75 mg a day.
He was encouraged to continue p.o. intake of water for hydration. He
was instructed to continue his home meds. Instructions were also
given regarding his activity level. He will be seen in the
Outpatient Clinic in 2 weeks time.
IMPRESSION: Status post endovascular complete obliteration of left MCA
bifurcation bilobed irregular aneurysm using stent assisted coiling.

## 2016-09-02 IMAGING — CR DG RIBS W/ CHEST 3+V*L*
4 series · 4 of 4 positions shown · non-contrast
Comparison: Chest radiograph 12/31/2013

CLINICAL DATA: Slipped and fell falling on top of a flower pot on
LEFT-sided chest, shortness of breath, painful to take a deep
breath, personal history of cerebral aneurysm and throat cancer

EXAM:
LEFT RIBS AND CHEST - 3+ VIEW

[w chest pa]
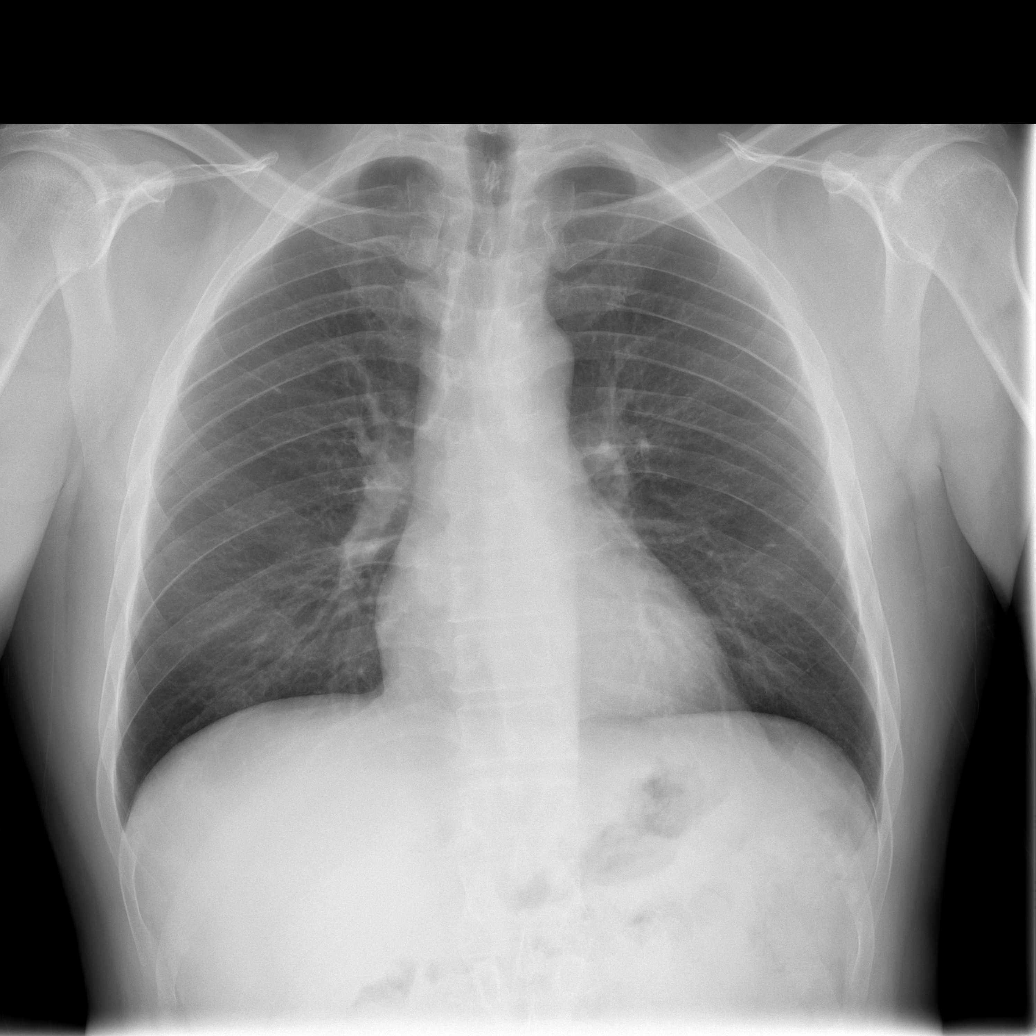

[w ribs ap/pa upper left]
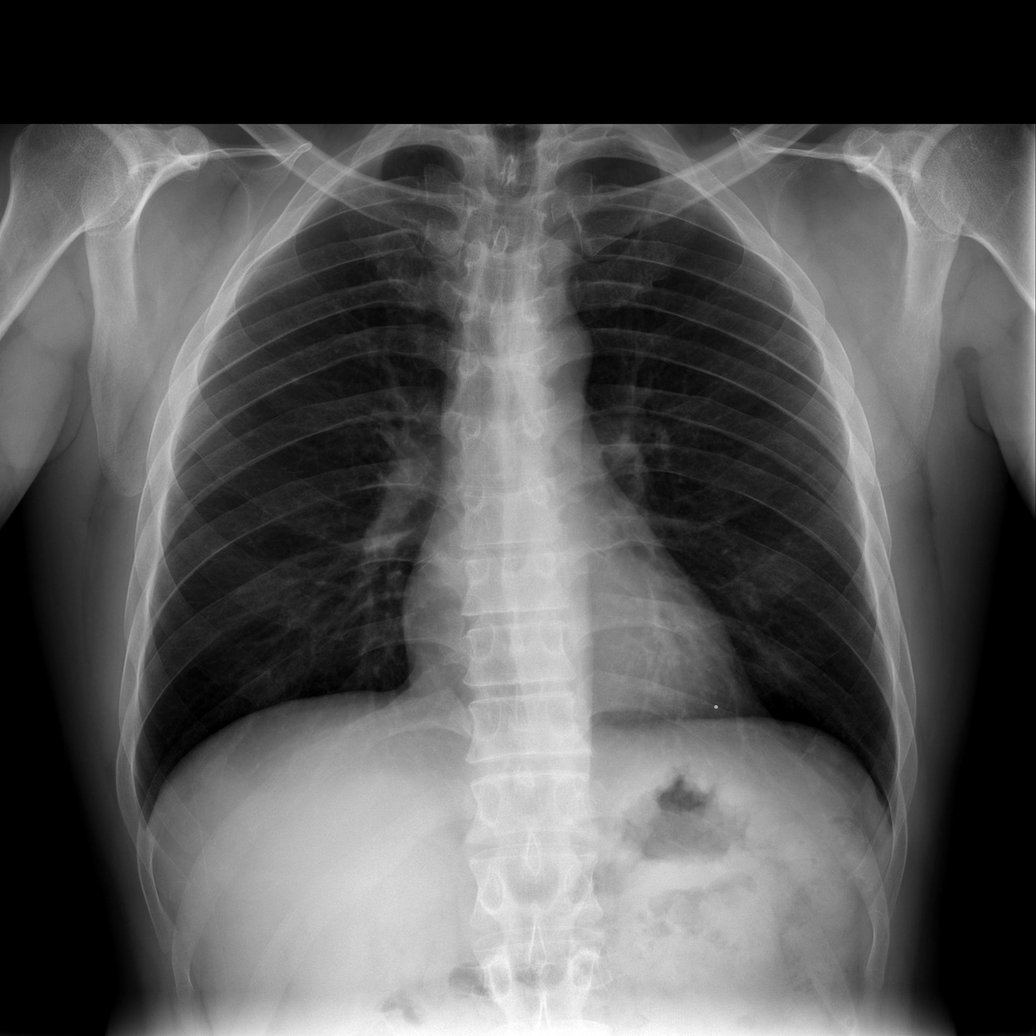

[w ribs ap/pa lower left]
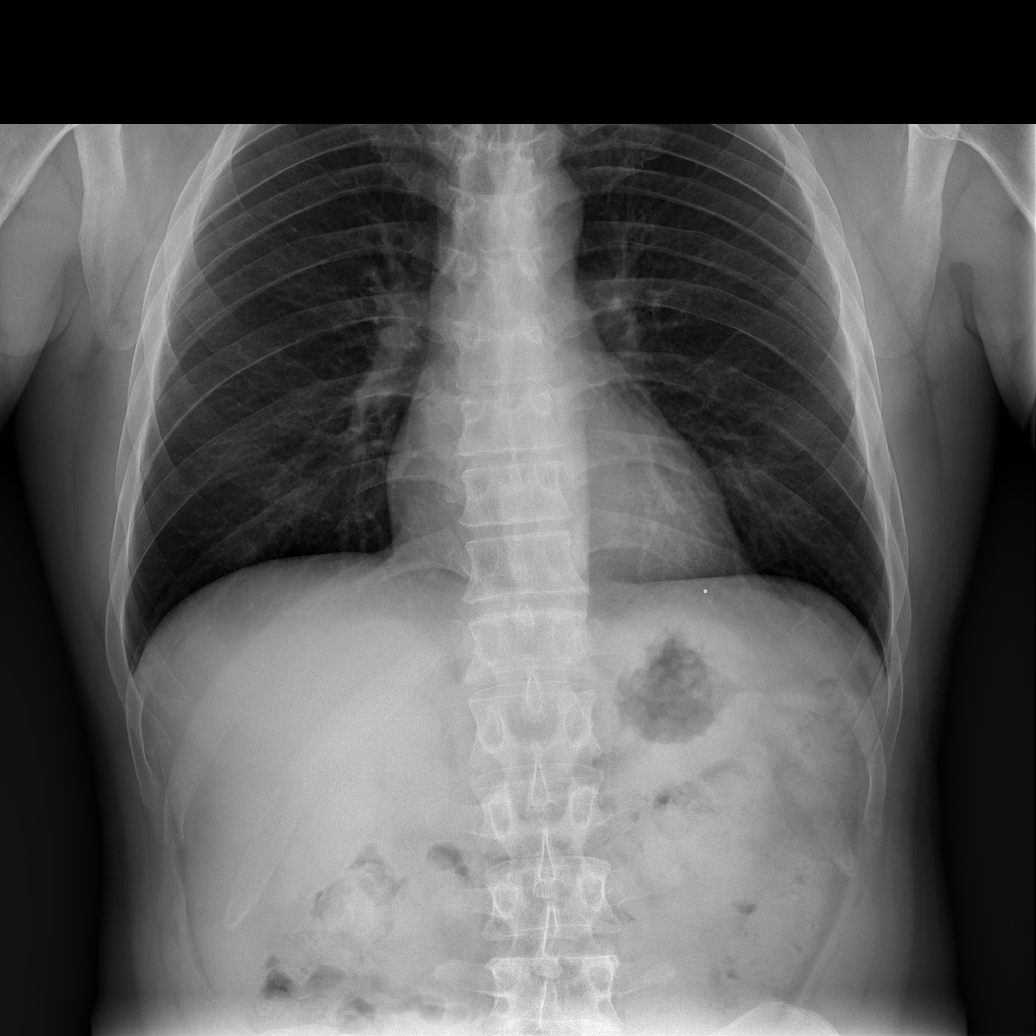

[w ribs oblique left]
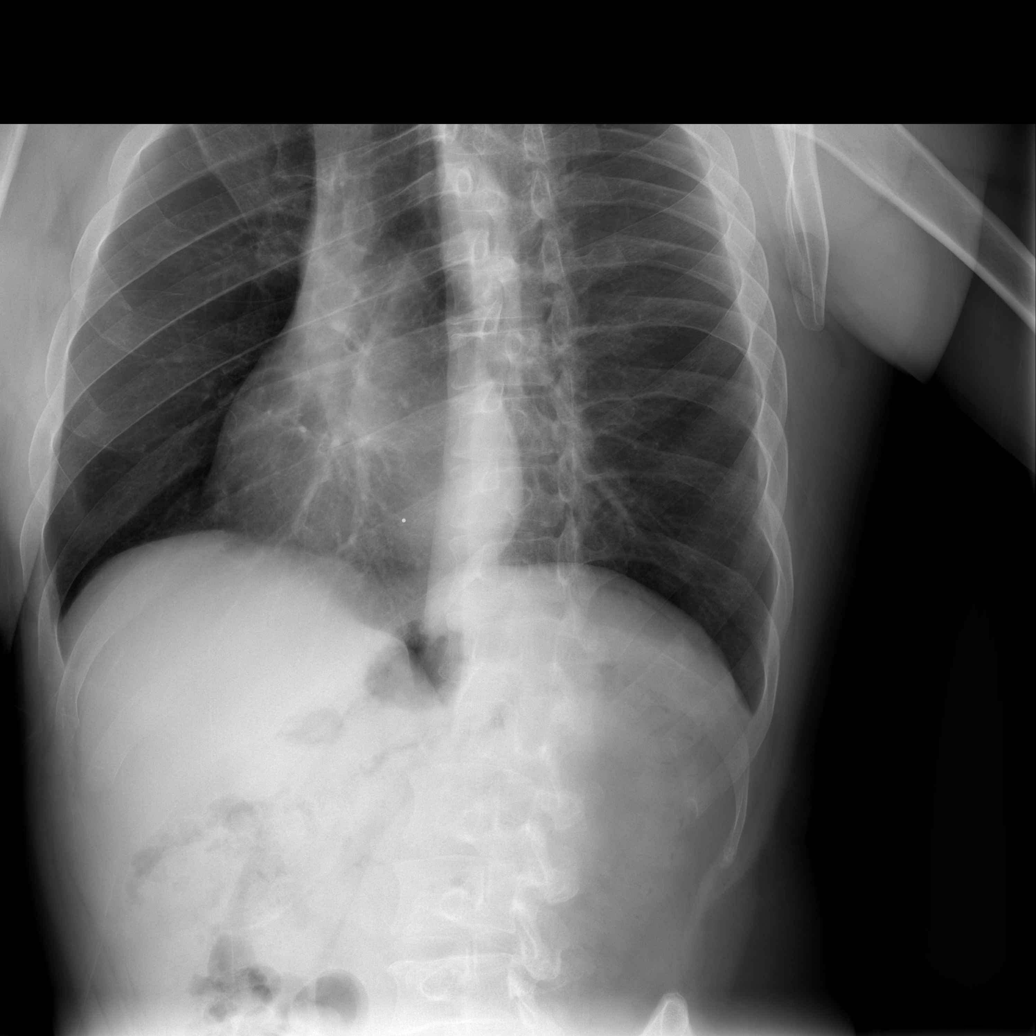

[4 of 4 positions shown; findings below may reference images not displayed]

FINDINGS: Normal heart size, mediastinal contours, and pulmonary vascularity.

Lungs clear.

No pleural effusion or pneumothorax.

Mild osseous demineralization.

BB placed at site of symptoms at anterior lower LEFT chest.

No definite rib fracture or bone destruction.
IMPRESSION: No acute abnormalities.

## 2016-09-20 ENCOUNTER — Encounter (HOSPITAL_BASED_OUTPATIENT_CLINIC_OR_DEPARTMENT_OTHER): Payer: Self-pay

## 2016-09-20 ENCOUNTER — Emergency Department (HOSPITAL_BASED_OUTPATIENT_CLINIC_OR_DEPARTMENT_OTHER): Payer: Medicaid Other

## 2016-09-20 ENCOUNTER — Emergency Department (HOSPITAL_BASED_OUTPATIENT_CLINIC_OR_DEPARTMENT_OTHER)
Admission: EM | Admit: 2016-09-20 | Discharge: 2016-09-20 | Disposition: A | Discharge status: Home / Self Care | Payer: Medicaid Other | Attending: Emergency Medicine | Admitting: Emergency Medicine

## 2016-09-20 DIAGNOSIS — R0789 Other chest pain: Secondary | ICD-10-CM | POA: Diagnosis present

## 2016-09-20 DIAGNOSIS — Y9355 Activity, bike riding: Secondary | ICD-10-CM | POA: Insufficient documentation

## 2016-09-20 DIAGNOSIS — F17200 Nicotine dependence, unspecified, uncomplicated: Secondary | ICD-10-CM | POA: Diagnosis not present

## 2016-09-20 DIAGNOSIS — I69351 Hemiplegia and hemiparesis following cerebral infarction affecting right dominant side: Secondary | ICD-10-CM | POA: Insufficient documentation

## 2016-09-20 DIAGNOSIS — Z8501 Personal history of malignant neoplasm of esophagus: Secondary | ICD-10-CM | POA: Insufficient documentation

## 2016-09-20 DIAGNOSIS — Z91040 Latex allergy status: Secondary | ICD-10-CM | POA: Diagnosis not present

## 2016-09-20 DIAGNOSIS — Z79899 Other long term (current) drug therapy: Secondary | ICD-10-CM | POA: Diagnosis not present

## 2016-09-20 DIAGNOSIS — Y998 Other external cause status: Secondary | ICD-10-CM | POA: Diagnosis not present

## 2016-09-20 DIAGNOSIS — R1012 Left upper quadrant pain: Secondary | ICD-10-CM | POA: Diagnosis not present

## 2016-09-20 DIAGNOSIS — Y92410 Unspecified street and highway as the place of occurrence of the external cause: Secondary | ICD-10-CM | POA: Insufficient documentation

## 2016-09-20 DIAGNOSIS — T148XXA Other injury of unspecified body region, initial encounter: Secondary | ICD-10-CM

## 2016-09-20 DIAGNOSIS — Z7902 Long term (current) use of antithrombotics/antiplatelets: Secondary | ICD-10-CM | POA: Insufficient documentation

## 2016-09-20 LAB — CBC WITH DIFFERENTIAL/PLATELET
BASOS ABS: 0 10*3/uL (ref 0.0–0.1)
Basophils Relative: 0 %
EOS ABS: 0.2 10*3/uL (ref 0.0–0.7)
Eosinophils Relative: 2 %
HEMATOCRIT: 38.9 % — AB (ref 39.0–52.0)
HEMOGLOBIN: 13.4 g/dL (ref 13.0–17.0)
Lymphocytes Relative: 17 %
Lymphs Abs: 1.7 10*3/uL (ref 0.7–4.0)
MCH: 24.4 pg — ABNORMAL LOW (ref 26.0–34.0)
MCHC: 34.4 g/dL (ref 30.0–36.0)
MCV: 70.9 fL — ABNORMAL LOW (ref 78.0–100.0)
Monocytes Absolute: 0.8 10*3/uL (ref 0.1–1.0)
Monocytes Relative: 8 %
NEUTROS ABS: 7.6 10*3/uL (ref 1.7–7.7)
NEUTROS PCT: 73 %
Platelets: 219 10*3/uL (ref 150–400)
RBC: 5.49 MIL/uL (ref 4.22–5.81)
RDW: 15.1 % (ref 11.5–15.5)
WBC: 10.3 10*3/uL (ref 4.0–10.5)

## 2016-09-20 LAB — COMPREHENSIVE METABOLIC PANEL
ALK PHOS: 58 U/L (ref 38–126)
ALT: 13 U/L — ABNORMAL LOW (ref 17–63)
ANION GAP: 8 (ref 5–15)
AST: 20 U/L (ref 15–41)
Albumin: 4 g/dL (ref 3.5–5.0)
BILIRUBIN TOTAL: 0.6 mg/dL (ref 0.3–1.2)
BUN: 11 mg/dL (ref 6–20)
CALCIUM: 9 mg/dL (ref 8.9–10.3)
CO2: 25 mmol/L (ref 22–32)
Chloride: 107 mmol/L (ref 101–111)
Creatinine, Ser: 1.32 mg/dL — ABNORMAL HIGH (ref 0.61–1.24)
GFR calc Af Amer: >60 mL/min (ref >60)
GFR calc non Af Amer: >60 mL/min (ref >60)
GLUCOSE: 106 mg/dL — AB (ref 65–99)
POTASSIUM: 3.4 mmol/L — AB (ref 3.5–5.1)
SODIUM: 140 mmol/L (ref 135–145)
Total Protein: 6.9 g/dL (ref 6.5–8.1)

## 2016-09-20 LAB — LIPASE, BLOOD: Lipase: 31 U/L (ref 11–51)

## 2016-09-20 MED ORDER — SODIUM CHLORIDE 0.9 % IV BOLUS (SEPSIS)
1000.0000 mL | Freq: Once | INTRAVENOUS | Status: AC
  Administered 2016-09-20: 1000 mL via INTRAVENOUS

## 2016-09-20 MED ORDER — ONDANSETRON HCL 4 MG/2ML IJ SOLN
4.0000 mg | Freq: Once | INTRAMUSCULAR | Status: AC
  Administered 2016-09-20: 4 mg via INTRAVENOUS
  Filled 2016-09-20: qty 2

## 2016-09-20 MED ORDER — MORPHINE SULFATE (PF) 4 MG/ML IV SOLN
4.0000 mg | Freq: Once | INTRAVENOUS | Status: AC
  Administered 2016-09-20: 4 mg via INTRAVENOUS
  Filled 2016-09-20: qty 1

## 2016-09-20 MED ORDER — MORPHINE SULFATE 15 MG PO TABS: 15.0000 mg | ORAL_TABLET | ORAL | 0 refills | Status: AC | PRN

## 2016-09-20 MED ORDER — IOPAMIDOL (ISOVUE-300) INJECTION 61%
100.0000 mL | Freq: Once | INTRAVENOUS | Status: AC | PRN
  Administered 2016-09-20: 100 mL via INTRAVENOUS

## 2016-09-20 NOTE — ED Notes (Signed)
Pt teaching provided on medications that may cause drowsiness. Pt instructed not to drive or operate heavy machinery while taking the prescribed medication. Pt verbalized understanding.   

## 2016-09-20 NOTE — ED Notes (Signed)
Pt returned from radiology.

## 2016-09-20 NOTE — ED Notes (Signed)
Large swollen area on L hip noticed, MD in to assess before pt leaves per pt request.
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
## 2016-09-20 NOTE — ED Notes (Signed)
ED Provider at bedside. 

## 2016-09-20 NOTE — ED Notes (Signed)
Pt ambulated to CT

## 2016-09-20 NOTE — Discharge Instructions (Signed)
Take 4 over the counter ibuprofen tablets 3 times a day or 2 over-the-counter naproxen tablets twice a day for pain. Also take tylenol 1000mg(2 extra strength) four times a day.    

## 2016-12-04 ENCOUNTER — Emergency Department (HOSPITAL_BASED_OUTPATIENT_CLINIC_OR_DEPARTMENT_OTHER): Payer: Medicaid Other

## 2016-12-04 ENCOUNTER — Encounter (HOSPITAL_BASED_OUTPATIENT_CLINIC_OR_DEPARTMENT_OTHER): Payer: Self-pay | Admitting: *Deleted

## 2016-12-04 ENCOUNTER — Emergency Department (HOSPITAL_BASED_OUTPATIENT_CLINIC_OR_DEPARTMENT_OTHER)
Admission: EM | Admit: 2016-12-04 | Discharge: 2016-12-04 | Disposition: A | Discharge status: Home / Self Care | Payer: Medicaid Other | Attending: Emergency Medicine | Admitting: Emergency Medicine

## 2016-12-04 DIAGNOSIS — I671 Cerebral aneurysm, nonruptured: Secondary | ICD-10-CM | POA: Insufficient documentation

## 2016-12-04 DIAGNOSIS — Z7902 Long term (current) use of antithrombotics/antiplatelets: Secondary | ICD-10-CM | POA: Diagnosis not present

## 2016-12-04 DIAGNOSIS — Z91040 Latex allergy status: Secondary | ICD-10-CM | POA: Insufficient documentation

## 2016-12-04 DIAGNOSIS — Z8673 Personal history of transient ischemic attack (TIA), and cerebral infarction without residual deficits: Secondary | ICD-10-CM | POA: Diagnosis not present

## 2016-12-04 DIAGNOSIS — Z79899 Other long term (current) drug therapy: Secondary | ICD-10-CM | POA: Diagnosis not present

## 2016-12-04 DIAGNOSIS — R519 Headache, unspecified: Secondary | ICD-10-CM

## 2016-12-04 DIAGNOSIS — R51 Headache: Secondary | ICD-10-CM | POA: Diagnosis present

## 2016-12-04 DIAGNOSIS — Z8501 Personal history of malignant neoplasm of esophagus: Secondary | ICD-10-CM | POA: Diagnosis not present

## 2016-12-04 DIAGNOSIS — F17200 Nicotine dependence, unspecified, uncomplicated: Secondary | ICD-10-CM | POA: Insufficient documentation

## 2016-12-04 DIAGNOSIS — Z7982 Long term (current) use of aspirin: Secondary | ICD-10-CM | POA: Insufficient documentation

## 2016-12-04 LAB — COMPREHENSIVE METABOLIC PANEL
ALT: 10 U/L — AB (ref 17–63)
AST: 19 U/L (ref 15–41)
Albumin: 4 g/dL (ref 3.5–5.0)
Alkaline Phosphatase: 61 U/L (ref 38–126)
Anion gap: 9 (ref 5–15)
BILIRUBIN TOTAL: 0.4 mg/dL (ref 0.3–1.2)
BUN: 8 mg/dL (ref 6–20)
CALCIUM: 8.8 mg/dL — AB (ref 8.9–10.3)
CO2: 24 mmol/L (ref 22–32)
Chloride: 108 mmol/L (ref 101–111)
Creatinine, Ser: 0.98 mg/dL (ref 0.61–1.24)
GFR calc Af Amer: >60 mL/min (ref >60)
GLUCOSE: 107 mg/dL — AB (ref 65–99)
Potassium: 3.7 mmol/L (ref 3.5–5.1)
SODIUM: 141 mmol/L (ref 135–145)
TOTAL PROTEIN: 7 g/dL (ref 6.5–8.1)

## 2016-12-04 LAB — TROPONIN I

## 2016-12-04 LAB — CBC WITH DIFFERENTIAL/PLATELET
BASOS ABS: 0 10*3/uL (ref 0.0–0.1)
Basophils Relative: 0 %
EOS PCT: 1 %
Eosinophils Absolute: 0.1 10*3/uL (ref 0.0–0.7)
HCT: 37.5 % — ABNORMAL LOW (ref 39.0–52.0)
Hemoglobin: 12.7 g/dL — ABNORMAL LOW (ref 13.0–17.0)
LYMPHS PCT: 19 %
Lymphs Abs: 1.8 10*3/uL (ref 0.7–4.0)
MCH: 24.1 pg — ABNORMAL LOW (ref 26.0–34.0)
MCHC: 33.9 g/dL (ref 30.0–36.0)
MCV: 71.3 fL — AB (ref 78.0–100.0)
MONO ABS: 0.6 10*3/uL (ref 0.1–1.0)
Monocytes Relative: 6 %
Neutro Abs: 7.3 10*3/uL (ref 1.7–7.7)
Neutrophils Relative %: 74 %
Platelets: 242 10*3/uL (ref 150–400)
RBC: 5.26 MIL/uL (ref 4.22–5.81)
RDW: 15.6 % — AB (ref 11.5–15.5)
WBC: 9.8 10*3/uL (ref 4.0–10.5)

## 2016-12-04 MED ORDER — METOCLOPRAMIDE HCL 5 MG/ML IJ SOLN
10.0000 mg | Freq: Once | INTRAMUSCULAR | Status: AC
  Administered 2016-12-04: 10 mg via INTRAVENOUS
  Filled 2016-12-04: qty 2

## 2016-12-04 MED ORDER — IOPAMIDOL (ISOVUE-370) INJECTION 76%
100.0000 mL | Freq: Once | INTRAVENOUS | Status: AC | PRN
  Administered 2016-12-04: 100 mL via INTRAVENOUS

## 2016-12-04 MED ORDER — DIPHENHYDRAMINE HCL 50 MG/ML IJ SOLN
25.0000 mg | Freq: Once | INTRAMUSCULAR | Status: AC
  Administered 2016-12-04: 25 mg via INTRAVENOUS
  Filled 2016-12-04: qty 1

## 2016-12-04 NOTE — ED Triage Notes (Signed)
Headache x 4 days. He saw his MD 3 days ago and she plans to have him scheduled for a carotid endarterectomy. He HGB was low and he was started on iron.

## 2016-12-04 NOTE — ED Provider Notes (Signed)
[place] DEPT [facility] Provider Note   CSN: [id number] Arrival date & time: 12/04/16  1054     History   Chief Complaint Chief Complaint  Patient presents with  . Headache    HPI Bradley Brock is a 51 y.o. male.  The history is provided by the patient. No language interpreter was used.  Headache      Bradley Brock is a 51 y.o. male who presents to the Emergency Department complaining of HA.  He presents for evaluation of headache that has been present for the last 3 days. He states that it is behind his left eye and is sharp and stabbing in nature. Headache is waxing and waning with associated nausea. 3 of similar but more severe headache in the past associated with brain aneurysm requiring coiling and stenting 3. Today he also developed some associated chest tightness that he thinks may have been a panic attack. His chest tightness has resolved. He denies any fevers, numbness, weakness, vision changes, vomiting. He took Tylenol as well as Aleve yesterday for the headache.  Past Medical History:  Diagnosis Date  . Acid reflux   . Aneurysm of anterior cerebral artery   . Anxiety   . Cancer [facility])    esophageal cancer - radiation on throat  . DDD (degenerative disc disease), cervical   . Headache   . Hyperlipemia   . TIA (transient ischemic attack) 12/30/2013    Patient Active Problem List   Diagnosis Date Noted  . Brain aneurysm 01/31/2014  . Cerebral aneurysm without rupture, L MCA 01/01/2014  . Left-sided weakness 12/31/2013  . R brain TIA 12/31/2013  . History of throat cancer 12/31/2013    Past Surgical History:  Procedure Laterality Date  . BIOPSY PHARYNX     esophageal  . OTHER SURGICAL HISTORY     cerebral stent  . RADIOLOGY WITH ANESTHESIA N/A 01/31/2014   Procedure: [place];  Surgeon: Rob Hickman, MD;  Location: [place];  Service: Radiology;  Laterality: N/A;       Home Medications    Prior to Admission medications    Medication Sig Start Date End Date Taking? Authorizing Provider  aspirin 325 MG tablet Take 1 tablet (325 mg total) by mouth daily. 01/01/14  Yes Velvet Bathe, MD  omeprazole (PRILOSEC) 20 MG capsule Take 20 mg by mouth daily.   Yes [provider]  ALPRAZolam (XANAX) 0.25 MG tablet Take 0.25 mg by mouth 2 (two) times daily.    [provider]  clopidogrel (PLAVIX) 75 MG tablet Take 75 mg by mouth daily.    [provider]  cyclobenzaprine (FLEXERIL) 10 MG tablet Take 10 mg by mouth 3 (three) times daily as needed for muscle spasms.    [provider]  fluticasone (FLONASE) 50 MCG/ACT nasal spray Place 1 spray into both nostrils daily.     [provider]  megestrol (MEGACE) 40 MG/ML suspension Take 200 mg by mouth daily.     [provider]  morphine (MSIR) 15 MG tablet Take 1 tablet (15 mg total) by mouth every 4 (four) hours as needed for severe pain. 09/20/16   Deno Etienne, DO  oxyCODONE-acetaminophen (PERCOCET) 10-325 MG per tablet Take 1 tablet by mouth every 6 (six) hours as needed for pain.    [provider]  pregabalin (LYRICA) 100 MG capsule Take 100 mg by mouth 2 (two) times daily.    [provider]  simvastatin (ZOCOR) 20 MG tablet Take 20  mg by mouth at bedtime.    [provider]  sodium chloride (OCEAN) 0.65 % SOLN nasal spray Place 1 spray into both nostrils daily.     [provider]  traMADol (ULTRAM) 50 MG tablet Take by mouth every 6 (six) hours as needed.    [provider]    Family History Family History  Problem Relation Age of Onset  . Cancer Mother   . Stomach cancer Brother   . Testicular cancer Brother     Social History Social History  Substance Use Topics  . Smoking status: Current Every Day Smoker  . Smokeless tobacco: Former Systems developer    Quit date: 04/13/2013  . Alcohol use No     Allergies   Latex; Vicodin [hydrocodone-acetaminophen]; and Tape   Review  of Systems Review of Systems  Neurological: Positive for headaches.  All other systems reviewed and are negative.    Physical Exam Updated Vital Signs BP 101/76   Pulse 63   Temp 98.5 F (36.9 C) (Oral)   Resp 16   Ht 5\' 9"  (1.753 m)   Wt 65.8 kg (145 lb)   SpO2 99%   BMI 21.41 kg/m   Physical Exam  Constitutional: He is oriented to person, place, and time. He appears well-developed and well-nourished.  HENT:  Head: Normocephalic and atraumatic.  Cardiovascular: Normal rate and regular rhythm.   No murmur heard. Pulmonary/Chest: Effort normal and breath sounds normal. No respiratory distress.  Abdominal: Soft. There is no tenderness. There is no rebound and no guarding.  Musculoskeletal: He exhibits no edema or tenderness.  Neurological: He is alert and oriented to person, place, and time.  No facial asymmetry. 5 out of 5 strength in all 4 extremities. Sensation to light touch intact in all 4 extremities.  Skin: Skin is warm and dry.  Psychiatric: He has a normal mood and affect. His behavior is normal.  Nursing note and vitals reviewed.    ED Treatments / Results  Labs (all labs ordered are listed, but only abnormal results are displayed) Labs Reviewed  COMPREHENSIVE METABOLIC PANEL - Abnormal; Notable for the following:       Result Value   Glucose, Bld 107 (*)    Calcium 8.8 (*)    ALT 10 (*)    All other components within normal limits  CBC WITH DIFFERENTIAL/PLATELET - Abnormal; Notable for the following:    Hemoglobin 12.7 (*)    HCT 37.5 (*)    MCV 71.3 (*)    MCH 24.1 (*)    RDW 15.6 (*)    All other components within normal limits  TROPONIN I    EKG  EKG Interpretation  Date/Time:  Friday December 04 2016 12:18:54 EDT Ventricular Rate:  55 PR Interval:    QRS Duration: 88 QT Interval:  414 QTC Calculation: 396 R Axis:   83 Text Interpretation:  Sinus rhythm Atrial premature complex ST elev, probable normal early repol pattern Confirmed by Quintella Reichert [phone]) on 12/04/2016 12:46:36 PM       Radiology Ct Angio Head W Or Wo Contrast  Result Date: 12/04/2016 CLINICAL DATA:  Headache for 4 days. History of left MCA aneurysm coiling in 2015. EXAM: CT ANGIOGRAPHY HEAD TECHNIQUE: Multidetector CT imaging of the head was performed using the standard protocol during bolus administration of intravenous contrast. Multiplanar CT image reconstructions and MIPs were obtained to evaluate the vascular anatomy. CONTRAST:  100 mL Isovue 370 COMPARISON:  03/11/2014 FINDINGS: CT HEAD Brain:  There is no evidence of acute infarct, intracranial hemorrhage, mass, midline shift, or extra-axial fluid collection. The ventricles and sulci are normal. Vascular: Left MCA stent and aneurysm coils with associated streak artifact. Skull: No fracture or focal osseous lesion. Sinuses: Partially visualized maxillary sinus mucous retention cysts, right larger than left. Clear mastoid air cells. Orbits: Unremarkable. CTA HEAD Anterior circulation: The internal carotid arteries are widely patent from skullbase to carotid termini. Sequelae of stent assisted coiling of a left MCA bifurcation aneurysm are again identified. No recurrent opacification of the treated aneurysm is identified. Streak artifact mildly limits evaluation of the distal most left M1 segment. The stent appears patent. The right MCA and both ACAs are patent without evidence of proximal branch occlusion or significant proximal stenosis. The right A1 segment is dominant. No new aneurysm is identified. Posterior circulation: The visualized distal vertebral arteries are widely patent to the basilar and codominant. Patent right PICA, bilateral AICA, and bilateral SCA origins are visualized. The basilar artery is widely patent. Posterior communicating arteries are not identified. PCAs are patent without evidence of significant stenosis. No aneurysm. Venous sinuses: Patent. Anatomic variants: None of significance. Delayed  phase: No abnormal enhancement. IMPRESSION: 1. Unchanged, satisfactory appearance of treated left MCA aneurysm. 2. No new aneurysm or acute intracranial abnormality identified. 3. Patent circle of Willis without significant proximal stenosis. Electronically Signed   By: Logan Bores M.D.   On: 12/04/2016 14:05    Procedures Procedures (including critical care time)  Medications Ordered in ED Medications  metoCLOPramide (REGLAN) injection 10 mg (10 mg Intravenous Given 12/04/16 1221)  diphenhydrAMINE (BENADRYL) injection 25 mg (25 mg Intravenous Given 12/04/16 1221)  iopamidol (ISOVUE-370) 76 % injection 100 mL (100 mLs Intravenous Contrast Given 12/04/16 1313)     Initial Impression / Assessment and Plan / ED Course  I have reviewed the triage vital signs and the nursing notes.  Pertinent labs & imaging results that were available during my care of the patient were reviewed by me and considered in my medical decision making (see chart for details).     Patient with history of cerebral aneurysm here for headaches similar to prior aneurysms but less severe in nature. He is well appearing on examination without focal neurologic deficits. No evidence of recurrence or worsening aneurysm on imaging. Presentation is not consistent with temporal arteritis, ankle closure glaucoma, meningitis. On repeat assessment his headache is resolved. Discussed outpatient follow up and return precautions.  Final Clinical Impressions(s) / ED Diagnoses   Final diagnoses:  Bad headache    New Prescriptions Discharge Medication List as of 12/04/2016  2:23 PM       Quintella Reichert, MD 12/04/16 [phone]

## 2020-12-24 ENCOUNTER — Other Ambulatory Visit: Payer: Self-pay

## 2020-12-24 ENCOUNTER — Encounter (HOSPITAL_BASED_OUTPATIENT_CLINIC_OR_DEPARTMENT_OTHER): Payer: Self-pay | Admitting: Emergency Medicine

## 2020-12-24 ENCOUNTER — Emergency Department (HOSPITAL_BASED_OUTPATIENT_CLINIC_OR_DEPARTMENT_OTHER)
Admission: EM | Admit: 2020-12-24 | Discharge: 2020-12-24 | Disposition: A | Discharge status: Home / Self Care | Payer: Medicaid Other | Attending: Emergency Medicine | Admitting: Emergency Medicine

## 2020-12-24 ENCOUNTER — Emergency Department (HOSPITAL_BASED_OUTPATIENT_CLINIC_OR_DEPARTMENT_OTHER): Payer: Medicaid Other

## 2020-12-24 DIAGNOSIS — G8929 Other chronic pain: Secondary | ICD-10-CM | POA: Diagnosis not present

## 2020-12-24 DIAGNOSIS — F17210 Nicotine dependence, cigarettes, uncomplicated: Secondary | ICD-10-CM | POA: Diagnosis not present

## 2020-12-24 DIAGNOSIS — M542 Cervicalgia: Secondary | ICD-10-CM

## 2020-12-24 DIAGNOSIS — Z7982 Long term (current) use of aspirin: Secondary | ICD-10-CM | POA: Insufficient documentation

## 2020-12-24 DIAGNOSIS — Z91040 Latex allergy status: Secondary | ICD-10-CM | POA: Insufficient documentation

## 2020-12-24 MED ORDER — LIDOCAINE 4 % EX PTCH: 1.0000 | MEDICATED_PATCH | Freq: Two times a day (BID) | CUTANEOUS | 0 refills | Status: AC | PRN

## 2020-12-24 MED ORDER — PREDNISONE 10 MG (21) PO TBPK: ORAL_TABLET | Freq: Every day | ORAL | 0 refills | Status: AC

## 2020-12-24 MED ORDER — LIDOCAINE 5 % EX PTCH
1.0000 | MEDICATED_PATCH | CUTANEOUS | Status: DC
  Administered 2020-12-24: 1 via TRANSDERMAL
  Filled 2020-12-24: qty 1
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
## 2020-12-24 NOTE — Discharge Instructions (Signed)
You came to the emergency department today to be evaluated for your neck pain.  Your physical exam was reassuring.  The x-ray imaging showed no acute fractures or dislocations.  I have given you a prescription for prednisone, please take this medication as prescribed.  Please follow-up with your primary care provider and the neurosurgeon I have listed on this paperwork.  Get help right away if: Your pain gets much worse and cannot be controlled with medicines. You have weakness or numbness in your hand, arm, face, or leg. You have a high fever. You have a stiff, rigid neck. You lose control of your bowels or your bladder (have incontinence). You have trouble with walking, balance, or speaking.

## 2020-12-24 NOTE — ED Provider Notes (Signed)
[facility] EMERGENCY DEPARTMENT Provider Note   CSN: [id number] Arrival date & time: 12/24/20  [id number]     History Chief Complaint  Patient presents with   Neck Pain    chronic    Bradley Brock is a 55 y.o. male with history of esophageal cancer status postradiation, degenerative disc disease cervical, TIA, anterior cerebral artery aneurysm.  Patient has seen Dr. Brigitte Pulse in the past for cervical neck pain.  Patient has previously performed physical therapy.  Patient presents to the emergency department with a chief complaint of neck pain.  Patient reports that he has had constant pain to his neck since an injury in 2007.  Patient reports that he has had increased pain over the last 3 weeks.  Pain is described as sharp.  Patient rates pain 7/10 on a pain scale.  Patient reports that pain radiates into left arm.  Patient endorses tingling sensation to left arm.  Pain is worse when he is sitting or laying in certain positions.   Patient reports that he was seen by his primary care provider last week and received 2 steroid injections and a prescription for Flexeril.  Patient reports that symptoms improved after receiving steroid injections.  Patient endorses a fall 2 weeks prior.  Patient states that he did not hit his head or lose consciousness.  Denies any facial asymmetry, aphasia, dysphagia, visual disturbance, saddle anesthesia, bowel or bladder dysfunction, fevers, chills, unexpected weight loss.     Neck Pain Associated symptoms: no chest pain, no fever, no headaches, no numbness and no weakness       Past Medical History:  Diagnosis Date   Acid reflux    Aneurysm of anterior cerebral artery    Anxiety    Cancer (HCC)    esophageal cancer - radiation on throat   DDD (degenerative disc disease), cervical    Headache    Hyperlipemia    TIA (transient ischemic attack) 12/30/2013    Patient Active Problem List   Diagnosis Date Noted   Brain aneurysm 01/31/2014    Cerebral aneurysm without rupture, L MCA 01/01/2014   Left-sided weakness 12/31/2013   R brain TIA 12/31/2013   History of throat cancer 12/31/2013    Past Surgical History:  Procedure Laterality Date   BIOPSY PHARYNX     esophageal   OTHER SURGICAL HISTORY     cerebral stent   RADIOLOGY WITH ANESTHESIA N/A 01/31/2014   Procedure: [place];  Surgeon: Rob Hickman, MD;  Location: [facility];  Service: Radiology;  Laterality: N/A;       Family History  Problem Relation Age of Onset   Cancer Mother    Stomach cancer Brother    Testicular cancer Brother     Social History   Tobacco Use   Smoking status: Every Day   Smokeless tobacco: Former    Quit date: 04/13/2013  Substance Use Topics   Alcohol use: No   Drug use: No    Types: Marijuana    Comment: Quit x4-5 days    Home Medications Prior to Admission medications   Medication Sig Start Date End Date Taking? Authorizing Provider  ALPRAZolam (XANAX) 0.25 MG tablet Take 0.25 mg by mouth 2 (two) times daily.    [provider]  aspirin 325 MG tablet Take 1 tablet (325 mg total) by mouth daily. 01/01/14   Velvet Bathe, MD  clopidogrel (PLAVIX) 75 MG tablet Take 75 mg by mouth daily.    [provider]  cyclobenzaprine (FLEXERIL) 10 MG tablet Take 10 mg by mouth 3 (three) times daily as needed for muscle spasms.    [provider]  fluticasone (FLONASE) 50 MCG/ACT nasal spray Place 1 spray into both nostrils daily.     [provider]  megestrol (MEGACE) 40 MG/ML suspension Take 200 mg by mouth daily.     [provider]  morphine (MSIR) 15 MG tablet Take 1 tablet (15 mg total) by mouth every 4 (four) hours as needed for severe pain. 09/20/16   Deno Etienne, DO  omeprazole (PRILOSEC) 20 MG capsule Take 20 mg by mouth daily.    [provider]  oxyCODONE-acetaminophen (PERCOCET) 10-325 MG per tablet Take 1 tablet by mouth every 6 (six) hours as  needed for pain.    [provider]  pregabalin (LYRICA) 100 MG capsule Take 100 mg by mouth 2 (two) times daily.    [provider]  simvastatin (ZOCOR) 20 MG tablet Take 20 mg by mouth at bedtime.    [provider]  sodium chloride (OCEAN) 0.65 % SOLN nasal spray Place 1 spray into both nostrils daily.     [provider]  traMADol (ULTRAM) 50 MG tablet Take by mouth every 6 (six) hours as needed.    [provider]    Allergies    Latex, Vicodin [hydrocodone-acetaminophen], and Tape  Review of Systems   Review of Systems  Constitutional:  Negative for chills, fever and unexpected weight change.  Eyes:  Negative for visual disturbance.  Respiratory:  Negative for shortness of breath.   Cardiovascular:  Negative for chest pain.  Gastrointestinal:  Negative for abdominal pain, nausea and vomiting.  Genitourinary:  Negative for difficulty urinating.  Musculoskeletal:  Positive for neck pain. Negative for back pain and neck stiffness.  Skin:  Negative for color change, pallor, rash and wound.  Neurological:  Negative for dizziness, tremors, seizures, syncope, facial asymmetry, speech difficulty, weakness, light-headedness, numbness and headaches.  Psychiatric/Behavioral:  Negative for confusion.    Physical Exam Updated Vital Signs BP 104/79   Pulse 85   Temp 98.2 F (36.8 C) (Oral)   Resp 14   Ht '5\' 8"'$  (1.727 m)   Wt 61.2 kg   SpO2 98%   BMI 20.53 kg/m   Physical Exam Vitals and nursing note reviewed.  Constitutional:      General: He is not in acute distress.    Appearance: He is not ill-appearing, toxic-appearing or diaphoretic.  Eyes:     General: No scleral icterus.       Right eye: No discharge.        Left eye: No discharge.     Extraocular Movements: Extraocular movements intact.     Pupils: Pupils are equal, round, and reactive to light.  Neck:     Comments: Tenderness to left cervical paraspinous muscles, left  trapezius muscle, and left sternocleidomastoid muscle. Cardiovascular:     Rate and Rhythm: Normal rate.     Pulses:          Radial pulses are 2+ on the right side and 2+ on the left side.  Pulmonary:     Effort: Pulmonary effort is normal. No tachypnea or bradypnea.  Musculoskeletal:     Cervical back: Normal range of motion. No edema, erythema, signs of trauma, rigidity, torticollis or crepitus. Pain with movement and muscular tenderness present. No spinous process tenderness. Normal range of motion.     Comments: No midline tenderness or  deformity to cervical, thoracic, or lumbar spine.  Skin:    General: Skin is warm and dry.  Neurological:     General: No focal deficit present.     Mental Status: He is alert and oriented to person, place, and time.     GCS: GCS eye subscore is 4. GCS verbal subscore is 5. GCS motor subscore is 6.     Cranial Nerves: No cranial nerve deficit or facial asymmetry.     Motor: No weakness, tremor, seizure activity or pronator drift.     Coordination: Finger-Nose-Finger Test normal.     Gait: Gait is intact. Gait normal.     Comments: CN II-XII intact; performed in supine position, +5 strength to bilateral upper extremities, +5 strength to dorsiflexion and plantarflexion, patient able to left both legs against gravity and hold each there without difficulty.    Psychiatric:        Behavior: Behavior is cooperative.    ED Results / Procedures / Treatments   Labs (all labs ordered are listed, but only abnormal results are displayed) Labs Reviewed - No data to display  EKG None  Radiology DG Cervical Spine Complete  Result Date: 12/24/2020 CLINICAL DATA:  Left upper extremity pain. EXAM: CERVICAL SPINE - COMPLETE 4+ VIEW COMPARISON:  MRI cervical spine 12/25/2018 and cervical spine radiographs 03/28/2016 . FINDINGS: Normal alignment of the cervical vertebral bodies. Disc spaces and vertebral bodies are maintained. No acute bony findings or destructive  bony changes. No abnormal prevertebral soft tissue swelling. The facets are normally aligned. Mild bony foraminal narrowing at C6-7 and C7-T1 due to uncinate spurring changes. The C1-2 articulations are maintained.  The lung apices are clear IMPRESSION: Normal alignment and no acute bony findings or significant degenerative changes. Mild bony foraminal narrowing at C6-7 and C7-T1 due to uncinate spurring changes. Electronically Signed   By: Marijo Sanes M.D.   On: 12/24/2020 13:53    Procedures Procedures   Medications Ordered in ED Medications - No data to display  ED Course  I have reviewed the triage vital signs and the nursing notes.  Pertinent labs & imaging results that were available during my care of the patient were reviewed by me and considered in my medical decision making (see chart for details).    MDM Rules/Calculators/A&P                           Alert 55 year old male in no acute distress, nontoxic appearing.  Presents with chief complaint of neck pain radiating into left arm.  Patient reports neck pain is chronic since 2007.  Patient reports intermittent episodes of pain radiating into the left arm.  Patient has a history of degenerative disc disease and cervical radiculopathy.  Neurologic exam is reassuring.  Will obtain x-ray imaging of cervical neck.    X-ray imaging shows normal alignment and no acute bony findings or significant degenerative changes.  Mild bony foraminal narrowing at C6-C7 and C7-T1 due to uncinate spurring changes.  Will prescribe patient with prednisone taper and lidocaine patch.  Patient to follow-up with neurosurgery.  Discussed results, findings, treatment and follow up. Patient advised of return precautions. Patient verbalized understanding and agreed with plan.   Final Clinical Impression(s) / ED Diagnoses Final diagnoses:  None    Rx / DC Orders ED Discharge Orders          Ordered    predniSONE (STERAPRED UNI-PAK 21 TAB) 10 MG  (21)  TBPK tablet  Daily        12/24/20 1415    Lidocaine (HM LIDOCAINE PATCH) 4 % PT[facility]  Every 12 hours PRN        12/24/20 1415             Loni Beckwith, PA-C 12/24/20 2144    Lorelle Gibbs, DO 12/26/20 1525
